# Patient Record
Sex: Female | Born: 1975 | Race: White | Hispanic: No | Marital: Single | Smoking: Former smoker
Health system: Southern US, Community
[De-identification: ages and names within clinical notes are randomized; demographics above are authoritative.]

## PROBLEM LIST (undated history)

## (undated) ENCOUNTER — Emergency Department (HOSPITAL_COMMUNITY): Payer: Self-pay | Source: Home / Self Care

## (undated) DIAGNOSIS — F329 Major depressive disorder, single episode, unspecified: Secondary | ICD-10-CM

## (undated) DIAGNOSIS — F32A Depression, unspecified: Secondary | ICD-10-CM

## (undated) DIAGNOSIS — I10 Essential (primary) hypertension: Secondary | ICD-10-CM

## (undated) DIAGNOSIS — E7800 Pure hypercholesterolemia, unspecified: Secondary | ICD-10-CM

## (undated) DIAGNOSIS — G2581 Restless legs syndrome: Secondary | ICD-10-CM

## (undated) DIAGNOSIS — F419 Anxiety disorder, unspecified: Secondary | ICD-10-CM

## (undated) DIAGNOSIS — N946 Dysmenorrhea, unspecified: Secondary | ICD-10-CM

## (undated) HISTORY — PX: HERNIA REPAIR: SHX51

## (undated) HISTORY — PX: CHOLECYSTECTOMY: SHX55

## (undated) HISTORY — PX: TUBAL LIGATION: SHX77

## (undated) HISTORY — DX: Anxiety disorder, unspecified: F41.9

## (undated) HISTORY — DX: Pure hypercholesterolemia, unspecified: E78.00

---

## 1997-08-16 ENCOUNTER — Emergency Department (HOSPITAL_COMMUNITY): Admission: EM | Admit: 1997-08-16 | Discharge: 1997-08-16 | Payer: Self-pay | Admitting: Emergency Medicine

## 1998-08-09 ENCOUNTER — Ambulatory Visit (HOSPITAL_COMMUNITY): Admission: RE | Admit: 1998-08-09 | Discharge: 1998-08-09 | Payer: Self-pay | Admitting: Orthopaedic Surgery

## 1998-09-20 ENCOUNTER — Encounter: Payer: Self-pay | Admitting: Family Medicine

## 1998-09-20 ENCOUNTER — Ambulatory Visit (HOSPITAL_COMMUNITY): Admission: RE | Admit: 1998-09-20 | Discharge: 1998-09-20 | Payer: Self-pay | Admitting: Family Medicine

## 1998-11-12 ENCOUNTER — Ambulatory Visit (HOSPITAL_COMMUNITY): Admission: RE | Admit: 1998-11-12 | Discharge: 1998-11-12 | Payer: Self-pay | Admitting: Family Medicine

## 1998-11-12 ENCOUNTER — Encounter: Payer: Self-pay | Admitting: Family Medicine

## 1998-12-13 ENCOUNTER — Ambulatory Visit (HOSPITAL_COMMUNITY): Admission: RE | Admit: 1998-12-13 | Discharge: 1998-12-13 | Payer: Self-pay | Admitting: Family Medicine

## 1998-12-13 ENCOUNTER — Encounter: Payer: Self-pay | Admitting: Family Medicine

## 1999-02-17 ENCOUNTER — Ambulatory Visit (HOSPITAL_COMMUNITY): Admission: RE | Admit: 1999-02-17 | Discharge: 1999-02-17 | Payer: Self-pay | Admitting: Family Medicine

## 1999-02-17 ENCOUNTER — Encounter: Payer: Self-pay | Admitting: Family Medicine

## 1999-03-05 ENCOUNTER — Inpatient Hospital Stay (HOSPITAL_COMMUNITY): Admission: AD | Admit: 1999-03-05 | Discharge: 1999-03-05 | Payer: Self-pay | Admitting: Family Medicine

## 1999-03-28 ENCOUNTER — Inpatient Hospital Stay (HOSPITAL_COMMUNITY): Admission: AD | Admit: 1999-03-28 | Discharge: 1999-03-28 | Payer: Self-pay | Admitting: Family Medicine

## 1999-04-24 ENCOUNTER — Inpatient Hospital Stay (HOSPITAL_COMMUNITY): Admission: AD | Admit: 1999-04-24 | Discharge: 1999-04-26 | Payer: Self-pay

## 1999-12-31 ENCOUNTER — Encounter: Payer: Self-pay | Admitting: Family Medicine

## 1999-12-31 ENCOUNTER — Ambulatory Visit (HOSPITAL_COMMUNITY): Admission: RE | Admit: 1999-12-31 | Discharge: 1999-12-31 | Payer: Self-pay | Admitting: Family Medicine

## 2001-06-29 ENCOUNTER — Other Ambulatory Visit: Admission: RE | Admit: 2001-06-29 | Discharge: 2001-06-29 | Payer: Self-pay | Admitting: Family Medicine

## 2001-09-30 ENCOUNTER — Encounter: Payer: Self-pay | Admitting: Family Medicine

## 2001-09-30 ENCOUNTER — Ambulatory Visit (HOSPITAL_COMMUNITY): Admission: RE | Admit: 2001-09-30 | Discharge: 2001-09-30 | Payer: Self-pay | Admitting: Family Medicine

## 2001-12-20 ENCOUNTER — Ambulatory Visit (HOSPITAL_COMMUNITY): Admission: RE | Admit: 2001-12-20 | Discharge: 2001-12-20 | Payer: Self-pay | Admitting: Family Medicine

## 2001-12-20 ENCOUNTER — Encounter: Payer: Self-pay | Admitting: Family Medicine

## 2002-02-16 ENCOUNTER — Encounter: Payer: Self-pay | Admitting: Family Medicine

## 2002-02-16 ENCOUNTER — Inpatient Hospital Stay (HOSPITAL_COMMUNITY): Admission: AD | Admit: 2002-02-16 | Discharge: 2002-02-16 | Payer: Self-pay | Admitting: *Deleted

## 2002-05-23 ENCOUNTER — Inpatient Hospital Stay (HOSPITAL_COMMUNITY): Admission: AD | Admit: 2002-05-23 | Discharge: 2002-05-26 | Payer: Self-pay | Admitting: Family Medicine

## 2002-05-25 ENCOUNTER — Encounter (INDEPENDENT_AMBULATORY_CARE_PROVIDER_SITE_OTHER): Payer: Self-pay | Admitting: *Deleted

## 2002-07-06 ENCOUNTER — Other Ambulatory Visit: Admission: RE | Admit: 2002-07-06 | Discharge: 2002-07-06 | Payer: Self-pay | Admitting: Family Medicine

## 2003-04-02 ENCOUNTER — Emergency Department (HOSPITAL_COMMUNITY): Admission: EM | Admit: 2003-04-02 | Discharge: 2003-04-02 | Payer: Self-pay | Admitting: Emergency Medicine

## 2003-08-02 ENCOUNTER — Other Ambulatory Visit: Admission: RE | Admit: 2003-08-02 | Discharge: 2003-08-02 | Payer: Self-pay | Admitting: Family Medicine

## 2004-08-07 ENCOUNTER — Other Ambulatory Visit: Admission: RE | Admit: 2004-08-07 | Discharge: 2004-08-07 | Payer: Self-pay | Admitting: Family Medicine

## 2005-08-14 ENCOUNTER — Other Ambulatory Visit: Admission: RE | Admit: 2005-08-14 | Discharge: 2005-08-14 | Payer: Self-pay | Admitting: Family Medicine

## 2006-08-12 ENCOUNTER — Other Ambulatory Visit: Admission: RE | Admit: 2006-08-12 | Discharge: 2006-08-12 | Payer: Self-pay | Admitting: Family Medicine

## 2006-09-02 ENCOUNTER — Ambulatory Visit (HOSPITAL_COMMUNITY): Admission: RE | Admit: 2006-09-02 | Discharge: 2006-09-02 | Payer: Self-pay | Admitting: Family Medicine

## 2009-06-21 ENCOUNTER — Encounter: Admission: RE | Admit: 2009-06-21 | Discharge: 2009-06-21 | Payer: Self-pay | Admitting: Family Medicine

## 2010-06-13 NOTE — Op Note (Signed)
   NAME:  Tara Woods, Tara Woods                        ACCOUNT NO.:  [id number]   MEDICAL RECORD NO.:  [id number]                   PATIENT TYPE:  INP   LOCATION:  9137                                 FACILITY:  [facility]   PHYSICIAN:  Conni Elliot, M.D.             DATE OF BIRTH:  [birth year]   DATE OF PROCEDURE:  05/25/2002  DATE OF DISCHARGE:                                 OPERATIVE REPORT   PREOPERATIVE DIAGNOSIS:  Desire for surgical sterilization.   POSTOPERATIVE DIAGNOSIS:  Desire for surgical sterilization.   PROCEDURE:  Modified bilateral Pomeroy tubal ligation.   SURGEON:  Conni Elliot, M.D.   ANESTHESIA:  General endotracheal.   OPERATIVE FINDINGS:  Uterus, tubes, and ovaries normal for post-gravid  state.   DESCRIPTION OF PROCEDURE:  With the patient placed under general  endotracheal anesthesia, the patient supine, the abdomen was prepped and  draped in sterile fashion.  A preumbilical incision was made and incision  made in the fascia, rectus muscles split in the midline, peritoneal cavity  entered.  The right fallopian tube was identified, grasped with a Babcock  clamp, followed to the fimbriated end.  A single tube was brought into the  operative field, doubly suture ligated, and an approximately 1.5-2 cm  segment was excised.  Hemostasis was adequate.  The procedure was repeated  on the opposite side.  Hemostasis was again adequate.  The anterior  peritoneum, fascia, subcutaneous, and skin were closed.  Estimated blood  loss was then 10 mL.  Needle and sponge count correct.                                               Conni Elliot, M.D.    ASG/MEDQ  D:  05/25/2002  T:  05/25/2002  Job:  [id number]

## 2011-05-30 ENCOUNTER — Emergency Department (HOSPITAL_COMMUNITY)
Admission: EM | Admit: 2011-05-30 | Discharge: 2011-05-30 | Disposition: A | Discharge status: Home / Self Care | Payer: Self-pay | Attending: Emergency Medicine | Admitting: Emergency Medicine

## 2011-05-30 ENCOUNTER — Encounter (HOSPITAL_COMMUNITY): Payer: Self-pay

## 2011-05-30 ENCOUNTER — Emergency Department (HOSPITAL_COMMUNITY): Payer: Self-pay

## 2011-05-30 DIAGNOSIS — IMO0002 Reserved for concepts with insufficient information to code with codable children: Secondary | ICD-10-CM | POA: Insufficient documentation

## 2011-05-30 DIAGNOSIS — F17200 Nicotine dependence, unspecified, uncomplicated: Secondary | ICD-10-CM | POA: Insufficient documentation

## 2011-05-30 DIAGNOSIS — R29898 Other symptoms and signs involving the musculoskeletal system: Secondary | ICD-10-CM | POA: Insufficient documentation

## 2011-05-30 DIAGNOSIS — M792 Neuralgia and neuritis, unspecified: Secondary | ICD-10-CM

## 2011-05-30 DIAGNOSIS — R209 Unspecified disturbances of skin sensation: Secondary | ICD-10-CM | POA: Insufficient documentation

## 2011-05-30 LAB — COMPREHENSIVE METABOLIC PANEL
ALT: 11 U/L (ref 0–35)
Alkaline Phosphatase: 70 U/L (ref 39–117)
BUN: 8 mg/dL (ref 6–23)
CO2: 25 mEq/L (ref 19–32)
Chloride: 101 mEq/L (ref 96–112)
GFR calc Af Amer: >90 mL/min (ref >90)
GFR calc non Af Amer: >90 mL/min (ref >90)
Glucose, Bld: 100 mg/dL — ABNORMAL HIGH (ref 70–99)
Potassium: 3.7 mEq/L (ref 3.5–5.1)
Sodium: 136 mEq/L (ref 135–145)
Total Bilirubin: 0.1 mg/dL — ABNORMAL LOW (ref 0.3–1.2)
Total Protein: 7.5 g/dL (ref 6.0–8.3)

## 2011-05-30 LAB — DIFFERENTIAL
Lymphocytes Relative: 38 % (ref 12–46)
Lymphs Abs: 3.6 10*3/uL (ref 0.7–4.0)
Monocytes Relative: 4 % (ref 3–12)
Neutro Abs: 5.3 10*3/uL (ref 1.7–7.7)
Neutrophils Relative %: 56 % (ref 43–77)

## 2011-05-30 LAB — CBC
Hemoglobin: 13.4 g/dL (ref 12.0–15.0)
MCH: 28.8 pg (ref 26.0–34.0)
Platelets: 257 10*3/uL (ref 150–400)
RBC: 4.66 MIL/uL (ref 3.87–5.11)
WBC: 9.5 10*3/uL (ref 4.0–10.5)

## 2011-05-30 NOTE — ED Notes (Signed)
Pt c/o left leg and arm weak and tingling, left eye hazy, pt states that the tingling started Tuesday on her left great toe and has spread to left arm and right foot area now. Pt  was seen at [facility], diagnosed with neuropathy, pt states that she is not getting any better, pt denies any pain, denies any recent illness.

## 2011-05-30 NOTE — Discharge Instructions (Signed)
Return tomorrow for ultrasound left lower leg.  If ultrasound neg then follow up with Dr. Abundio Miu

## 2011-05-30 NOTE — ED Notes (Signed)
Pt reprots numbness that started on left leg great toe and has spread gradually up to left leg and left arm. Denies any other symptoms.  Describes left eye as "being fuzzy today", was seen at a local er and told neuropathy

## 2011-05-30 NOTE — ED Notes (Addendum)
Pt returned from xray, ambulatory to restroom, tolerated well, update given

## 2011-05-30 NOTE — ED Provider Notes (Signed)
History     CSN: [id number]  Arrival date & time 05/30/11  1628   First MD Initiated Contact with Patient 05/30/11 1656      Chief Complaint  Patient presents with  . Numbness  . Extremity Weakness    (Consider location/radiation/quality/duration/timing/severity/associated sxs/prior treatment) Patient is a 36 y.o. female presenting with extremity weakness. The history is provided by the patient (the pt complains of numbness to her left leg and some in her hands and right foot). No language interpreter was used.  Extremity Weakness This is a new problem. The current episode started more than 2 days ago. The problem occurs constantly. The problem has not changed since onset.Pertinent negatives include no chest pain, no abdominal pain and no headaches. The symptoms are aggravated by nothing. The symptoms are relieved by nothing. Treatments tried: voltaren. The treatment provided no relief.    History reviewed. No pertinent past medical history.  Past Surgical History  Procedure Date  . Tubal ligation   . Cholecystectomy   . Hernia repair     No family history on file.  History  Substance Use Topics  . Smoking status: Current Everyday Smoker    Types: Cigarettes  . Smokeless tobacco: Not on file  . Alcohol Use: No    OB History    Grav Para Term Preterm Abortions TAB SAB Ect Mult Living                  Review of Systems  Constitutional: Negative for fatigue.  HENT: Negative for congestion, sinus pressure and ear discharge.   Eyes: Negative for discharge.  Respiratory: Negative for cough.   Cardiovascular: Negative for chest pain.  Gastrointestinal: Negative for abdominal pain and diarrhea.  Genitourinary: Negative for frequency and hematuria.  Musculoskeletal: Positive for extremity weakness. Negative for back pain.       Numbness to left leg  Skin: Negative for rash.  Neurological: Negative for seizures and headaches.  Hematological: Negative.     Psychiatric/Behavioral: Negative for hallucinations.    Allergies  Amoxapine and related and Penicillins  Home Medications   Current Outpatient Rx  Name Route Sig Dispense Refill  . DICLOFENAC SODIUM 75 MG PO TBEC Oral Take 75 mg by mouth 2 (two) times daily.      BP 107/60  Pulse 87  Temp(Src) 98.3 F (36.8 C) (Oral)  Resp 18  Ht 5\' 4"  (1.626 m)  Wt 141 lb (63.957 kg)  BMI 24.20 kg/m2  SpO2 98%  LMP 05/18/2011  Physical Exam  Constitutional: She is oriented to person, place, and time. She appears well-developed.  HENT:  Head: Normocephalic and atraumatic.  Eyes: Conjunctivae and EOM are normal. No scleral icterus.  Neck: Neck supple. No thyromegaly present.  Cardiovascular: Normal rate and regular rhythm.  Exam reveals no gallop and no friction rub.   No murmur heard. Pulmonary/Chest: No stridor. She has no wheezes. She has no rales. She exhibits no tenderness.  Abdominal: She exhibits no distension. There is no tenderness. There is no rebound.  Musculoskeletal: Normal range of motion. She exhibits no edema.       Mild decrease sensation left foot.  Minor tenderness to popliteal area.  Reflexes and strength nl  Lymphadenopathy:    She has no cervical adenopathy.  Neurological: She is oriented to person, place, and time. Coordination normal.  Skin: No rash noted. No erythema.  Psychiatric: She has a normal mood and affect. Her behavior is normal.    ED Course  Procedures (including critical care time)  Labs Reviewed  COMPREHENSIVE METABOLIC PANEL - Abnormal; Notable for the following:    Glucose, Bld 100 (*)    Total Bilirubin 0.1 (*)    All other components within normal limits  CBC  DIFFERENTIAL   Ct Head Wo Contrast  05/30/2011  *RADIOLOGY REPORT*  Clinical Data: 36 year old female with left upper extremity tingling and weakness.  CT HEAD WITHOUT CONTRAST  Technique:  Contiguous axial images were obtained from the base of the skull through the vertex without  contrast.  Comparison: None.  Findings: Visualized paranasal sinuses and mastoids are clear.  No acute osseous abnormality identified.  Visualized orbits and scalp soft tissues are within normal limits.  The Cerebral volume is within normal limits for age.  No midline shift, ventriculomegaly, mass effect, evidence of mass lesion, intracranial hemorrhage or evidence of cortically based acute infarction.  Gray-white matter differentiation is within normal limits throughout the brain.  No suspicious intracranial vascular hyperdensity.  IMPRESSION: Normal noncontrast CT appearance of the brain.  Original Report Authenticated By: Ulla Potash III, M.D.     1. Neuritis       MDM  Neuritis left lower extr.  Unknown cause.  Will get [place] in am        Benny Lennert, MD 05/30/11 1950

## 2011-05-31 ENCOUNTER — Ambulatory Visit (HOSPITAL_COMMUNITY)
Admit: 2011-05-31 | Discharge: 2011-05-31 | Disposition: A | Discharge status: Home / Self Care | Payer: Self-pay | Source: Ambulatory Visit | Attending: Emergency Medicine | Admitting: Emergency Medicine

## 2011-05-31 ENCOUNTER — Other Ambulatory Visit (HOSPITAL_COMMUNITY): Payer: Self-pay | Admitting: Emergency Medicine

## 2011-05-31 DIAGNOSIS — R52 Pain, unspecified: Secondary | ICD-10-CM

## 2011-05-31 DIAGNOSIS — M7989 Other specified soft tissue disorders: Secondary | ICD-10-CM | POA: Insufficient documentation

## 2011-05-31 DIAGNOSIS — M79609 Pain in unspecified limb: Secondary | ICD-10-CM | POA: Insufficient documentation

## 2014-07-17 ENCOUNTER — Other Ambulatory Visit (HOSPITAL_COMMUNITY): Payer: Self-pay | Admitting: Family Medicine

## 2014-07-17 DIAGNOSIS — M7989 Other specified soft tissue disorders: Principal | ICD-10-CM

## 2014-07-17 DIAGNOSIS — M79662 Pain in left lower leg: Secondary | ICD-10-CM

## 2014-07-18 ENCOUNTER — Ambulatory Visit (HOSPITAL_COMMUNITY)
Admission: RE | Admit: 2014-07-18 | Discharge: 2014-07-18 | Disposition: A | Discharge status: Home / Self Care | Payer: 59 | Source: Ambulatory Visit | Attending: Family Medicine | Admitting: Family Medicine

## 2014-07-18 DIAGNOSIS — M7989 Other specified soft tissue disorders: Secondary | ICD-10-CM | POA: Diagnosis not present

## 2014-07-18 DIAGNOSIS — M79605 Pain in left leg: Secondary | ICD-10-CM

## 2014-07-18 DIAGNOSIS — M79662 Pain in left lower leg: Secondary | ICD-10-CM

## 2014-07-18 NOTE — Progress Notes (Signed)
*  PRELIMINARY RESULTS* Vascular Ultrasound Left Lower Extremity Venous Duplex Completed.  Preliminary Results   Preliminary Results  Preliminary Results  Preliminary Results: Left No evidence of DVT, Superficial Thrombosis, or Backer's Cyst.  Tara Woods M 07/18/2014, 9:21 AM

## 2017-01-13 ENCOUNTER — Other Ambulatory Visit: Payer: Self-pay

## 2017-01-13 ENCOUNTER — Emergency Department (HOSPITAL_COMMUNITY): Payer: Managed Care, Other (non HMO)

## 2017-01-13 ENCOUNTER — Emergency Department (HOSPITAL_COMMUNITY)
Admission: EM | Admit: 2017-01-13 | Discharge: 2017-01-13 | Disposition: A | Discharge status: Home / Self Care | Payer: Managed Care, Other (non HMO) | Attending: Emergency Medicine | Admitting: Emergency Medicine

## 2017-01-13 ENCOUNTER — Encounter (HOSPITAL_COMMUNITY): Payer: Self-pay | Admitting: Emergency Medicine

## 2017-01-13 DIAGNOSIS — R197 Diarrhea, unspecified: Secondary | ICD-10-CM | POA: Diagnosis not present

## 2017-01-13 DIAGNOSIS — F17210 Nicotine dependence, cigarettes, uncomplicated: Secondary | ICD-10-CM | POA: Diagnosis not present

## 2017-01-13 DIAGNOSIS — Z9049 Acquired absence of other specified parts of digestive tract: Secondary | ICD-10-CM | POA: Insufficient documentation

## 2017-01-13 DIAGNOSIS — R1013 Epigastric pain: Secondary | ICD-10-CM | POA: Diagnosis not present

## 2017-01-13 DIAGNOSIS — R112 Nausea with vomiting, unspecified: Secondary | ICD-10-CM | POA: Diagnosis present

## 2017-01-13 DIAGNOSIS — Z79899 Other long term (current) drug therapy: Secondary | ICD-10-CM | POA: Insufficient documentation

## 2017-01-13 LAB — PREGNANCY, URINE: PREG TEST UR: NEGATIVE

## 2017-01-13 LAB — COMPREHENSIVE METABOLIC PANEL
ALBUMIN: 3.8 g/dL (ref 3.5–5.0)
ALK PHOS: 66 U/L (ref 38–126)
ALT: 14 U/L (ref 14–54)
ANION GAP: 11 (ref 5–15)
AST: 23 U/L (ref 15–41)
BILIRUBIN TOTAL: 0.3 mg/dL (ref 0.3–1.2)
BUN: <5 mg/dL — ABNORMAL LOW (ref 6–20)
CALCIUM: 9.6 mg/dL (ref 8.9–10.3)
CO2: 27 mmol/L (ref 22–32)
Chloride: 104 mmol/L (ref 101–111)
Creatinine, Ser: 0.61 mg/dL (ref 0.44–1.00)
GFR calc non Af Amer: >60 mL/min (ref >60)
GLUCOSE: 108 mg/dL — AB (ref 65–99)
POTASSIUM: 3.2 mmol/L — AB (ref 3.5–5.1)
SODIUM: 142 mmol/L (ref 135–145)
TOTAL PROTEIN: 7.9 g/dL (ref 6.5–8.1)

## 2017-01-13 LAB — CBC WITH DIFFERENTIAL/PLATELET
BASOS ABS: 0.1 10*3/uL (ref 0.0–0.1)
BASOS PCT: 0 %
Eosinophils Absolute: 0.1 10*3/uL (ref 0.0–0.7)
Eosinophils Relative: 0 %
HEMATOCRIT: 39.7 % (ref 36.0–46.0)
HEMOGLOBIN: 12.7 g/dL (ref 12.0–15.0)
LYMPHS PCT: 22 %
Lymphs Abs: 3.5 10*3/uL (ref 0.7–4.0)
MCH: 27.2 pg (ref 26.0–34.0)
MCHC: 32 g/dL (ref 30.0–36.0)
MCV: 85 fL (ref 78.0–100.0)
Monocytes Absolute: 1 10*3/uL (ref 0.1–1.0)
Monocytes Relative: 7 %
NEUTROS ABS: 11.2 10*3/uL — AB (ref 1.7–7.7)
NEUTROS PCT: 71 %
Platelets: 418 10*3/uL — ABNORMAL HIGH (ref 150–400)
RBC: 4.67 MIL/uL (ref 3.87–5.11)
RDW: 14.1 % (ref 11.5–15.5)
WBC: 15.8 10*3/uL — ABNORMAL HIGH (ref 4.0–10.5)

## 2017-01-13 LAB — URINALYSIS, ROUTINE W REFLEX MICROSCOPIC
Bilirubin Urine: NEGATIVE
Glucose, UA: NEGATIVE mg/dL
Hgb urine dipstick: NEGATIVE
Ketones, ur: NEGATIVE mg/dL
LEUKOCYTES UA: NEGATIVE
NITRITE: NEGATIVE
PH: 6 (ref 5.0–8.0)
Protein, ur: NEGATIVE mg/dL
SPECIFIC GRAVITY, URINE: 1.005 (ref 1.005–1.030)

## 2017-01-13 LAB — POC URINE PREG, ED: PREG TEST UR: NEGATIVE

## 2017-01-13 LAB — LIPASE, BLOOD: Lipase: 27 U/L (ref 11–51)

## 2017-01-13 MED ORDER — DICYCLOMINE HCL 20 MG PO TABS: 20.0000 mg | ORAL_TABLET | Freq: Two times a day (BID) | ORAL | 0 refills | Status: DC

## 2017-01-13 MED ORDER — ONDANSETRON HCL 4 MG PO TABS: 4.0000 mg | ORAL_TABLET | Freq: Four times a day (QID) | ORAL | 0 refills | Status: DC

## 2017-01-13 MED ORDER — POTASSIUM CHLORIDE ER 10 MEQ PO TBCR: 10.0000 meq | EXTENDED_RELEASE_TABLET | Freq: Two times a day (BID) | ORAL | 0 refills | Status: DC

## 2017-01-13 MED ORDER — IOPAMIDOL (ISOVUE-300) INJECTION 61%
100.0000 mL | Freq: Once | INTRAVENOUS | Status: AC | PRN
  Administered 2017-01-13: 100 mL via INTRAVENOUS

## 2017-01-13 MED ORDER — SODIUM CHLORIDE 0.9 % IV BOLUS (SEPSIS)
1000.0000 mL | Freq: Once | INTRAVENOUS | Status: AC
  Administered 2017-01-13: 1000 mL via INTRAVENOUS

## 2017-01-13 NOTE — ED Provider Notes (Signed)
[facility] EMERGENCY DEPARTMENT Provider Note   CSN: [id number] Arrival date & time: 01/13/17  4742     History   Chief Complaint Chief Complaint  Patient presents with  . Abdominal Pain  . Emesis  . Diarrhea    HPI Tara Woods is a 41 y.o. female.  HPI   Patient is a 41 year old female with an abdominal surgical history of cholecystectomy, hernia repair, and tubal ligation and no other medical history presenting for nausea, vomiting, diarrhea, and epigastric to right-sided pain for the past 24-48 hours.  Patient reports that she began having a sore throat approximately 5 days ago and presented to urgent care where she was given azithromycin.  Subsequently, patient developed loose stools without melena or hematochezia.  Patient reports that she progressed to having nausea with emesis throughout this week.  Approximately 24-48 hours ago, patient began having a right upper quadrant dull ache that then progressed down her right lower abdomen to her right lower quadrant.  Pain does not radiate up to the right shoulder or into the back.  No pain in the flanks.  Patient reports it feels similar to her prior episode of gallstones, however she had a cholecystectomy.  No fever or chills with this illness.  Patient denies dysuria, vaginal discharge, or vaginal bleeding.  Last menstrual period was 1.5 weeks ago.  Patient is sexually active with one female partner.  History reviewed. No pertinent past medical history.  There are no active problems to display for this patient.   Past Surgical History:  Procedure Laterality Date  . CHOLECYSTECTOMY    . HERNIA REPAIR    . TUBAL LIGATION      OB History    No data available       Home Medications    Prior to Admission medications   Medication Sig Start Date End Date Taking? Authorizing Provider  diclofenac (VOLTAREN) 75 MG EC tablet Take 75 mg by mouth 2 (two) times daily.    [provider]    Family History History  reviewed. No pertinent family history.  Social History Social History   Tobacco Use  . Smoking status: Current Every Day Smoker    Types: Cigarettes  . Smokeless tobacco: Never Used  Substance Use Topics  . Alcohol use: No  . Drug use: No     Allergies   Amoxapine and related and Penicillins   Review of Systems Review of Systems  Constitutional: Negative for chills and fever.  HENT: Negative for congestion and sore throat.   Respiratory: Negative for cough.   Cardiovascular: Negative for chest pain.  Gastrointestinal: Positive for abdominal distention, diarrhea, nausea and vomiting. Negative for blood in stool.  Genitourinary: Negative for dysuria and flank pain.  Musculoskeletal: Negative for back pain.  Skin: Negative for rash.  All other systems reviewed and are negative.    Physical Exam Updated Vital Signs BP 118/82   Pulse (!) 108   Temp 98.2 F (36.8 C) (Oral)   Resp 17   LMP 01/04/2017   SpO2 96%   Physical Exam  Constitutional: She appears well-developed and well-nourished. No distress.  HENT:  Head: Normocephalic and atraumatic.  Mouth/Throat: Oropharynx is clear and moist.  Eyes: Conjunctivae and EOM are normal. Pupils are equal, round, and reactive to light.  Neck: Normal range of motion. Neck supple.  Cardiovascular: Normal rate, regular rhythm, S1 normal and S2 normal.  No murmur heard. Pulmonary/Chest: Effort normal and breath sounds normal. She has no wheezes.  She has no rales.  Abdominal: Soft. She exhibits no distension. There is tenderness in the right lower quadrant and epigastric area. There is no rebound, no guarding and no CVA tenderness.  No suprapubic or left lower quadrant tenderness.  Musculoskeletal: Normal range of motion. She exhibits no edema or deformity.  Lymphadenopathy:    She has no cervical adenopathy.  Neurological: She is alert.  Patient was extremities symmetrically and with good coordination.  Skin: Skin is warm and  dry. No rash noted. No erythema.  Psychiatric: She has a normal mood and affect. Her behavior is normal. Judgment and thought content normal.  Nursing note and vitals reviewed.    ED Treatments / Results  Labs (all labs ordered are listed, but only abnormal results are displayed) Labs Reviewed  COMPREHENSIVE METABOLIC PANEL  CBC WITH DIFFERENTIAL/PLATELET  LIPASE, BLOOD  URINALYSIS, ROUTINE W REFLEX MICROSCOPIC  PREGNANCY, URINE  POC URINE PREG, ED    EKG  EKG Interpretation None       Radiology Ct Abdomen Pelvis W Contrast  Result Date: 01/13/2017 CLINICAL DATA:  41 year old with abdominal pain and suspected appendicitis. History of cholecystectomy. EXAM: CT ABDOMEN AND PELVIS WITH CONTRAST TECHNIQUE: Multidetector CT imaging of the abdomen and pelvis was performed using the standard protocol following bolus administration of intravenous contrast. CONTRAST:  167mL ISOVUE-300 IOPAMIDOL (ISOVUE-300) INJECTION 61% COMPARISON:  11/28/2010 FINDINGS: Lower chest: Minimal scarring or atelectasis at the lung bases. Otherwise, the lung bases are clear. Hepatobiliary: Gallbladder has been removed. Normal appearance of the liver without biliary dilatation. Portal venous system is patent. Pancreas: Normal appearance of the pancreas without inflammation or duct dilatation. Spleen: Normal appearance of spleen without enlargement. Adrenals/Urinary Tract: Normal adrenal glands. Urinary bladder is unremarkable. Negative for hydronephrosis. 1.1 cm low-density structure in the left kidney interpolar region is most compatible with a cyst but this has enlarged from the previous examination. No suspicious renal lesions. Stomach/Bowel: Stomach is within normal limits. Appendix appears normal. No evidence of bowel wall thickening, distention, or inflammatory changes. Vascular/Lymphatic: Main visceral arteries are patent. Atherosclerotic calcifications in the abdominal aorta without aneurysm. Main venous  structures appear to be patent. No lymph node enlargement in the abdomen or pelvis. Reproductive: Again noted is a 1.2 cm low-density structure in the left adnexa region adjacent to the uterus on sequence 2, image 69. This has not changed since 2012 and probably represents a small benign adnexal cyst. Alternatively, this could represent an exophytic low-density fibroid. No acute abnormality involving the uterus or ovaries. Other: Trace fluid in the pelvis is likely physiologic. Negative for free air. Musculoskeletal: No acute or significant osseous findings. IMPRESSION: No acute abnormality in the abdomen or pelvis. Specifically, normal appearance of the appendix. Electronically Signed   By: Markus Daft M.D.   On: 01/13/2017 12:36    Procedures Procedures (including critical care time)  Medications Ordered in ED Medications  sodium chloride 0.9 % bolus 1,000 mL (1,000 mLs Intravenous New Bag/Given 01/13/17 1062)     Initial Impression / Assessment and Plan / ED Course  I have reviewed the triage vital signs and the nursing notes.  Pertinent labs & imaging results that were available during my care of the patient were reviewed by me and considered in my medical decision making (see chart for details).     Final Clinical Impressions(s) / ED Diagnoses   Final diagnoses:  Epigastric pain  Non-intractable vomiting with nausea, unspecified vomiting type  Diarrhea, unspecified type   Differential diagnosis includes  pancreatitis, choledocholithiasis, gastritis, pyelonephritis, appendicitis, SBO.  Will obtain CBC, CMP, lipase, urinalysis, and urine pregnancy.  CT abdomen pelvis with contrast ordered.  Engaged in shared decision making with patient regarding the utility of CT and the current episode of abdominal pain.  Patient understood risks of radiation was agreeable to continue with CT. patient initially slightly tachycardic.  This was rechecked in room and pulse is 99.  Will initiate fluid  repletion.  9:59 AM Patient was offered medication for pain and nausea.  Patient declined any medication at this time.  I discussed with patient that should her pain get worse or she require antiemetics, they can be ordered for her.  Patient is afebrile, nontoxic appearing, in no apparent distress.  Patient's pain and other symptoms adequately managed in emergency department.  Fluid bolus given.  Labs, imaging and vitals reviewed.  Patient does not meet the SIRS or Sepsis criteria.  On repeat exam patient does not have a surgical abdomen and there are no peritoneal signs.  No indication of appendicitis, bowel obstruction, bowel perforation, cholecystitis, diverticulitis, acute mesenteric ischemia, PID, ectopic pregnancy, or ovarian torsion.  CT scan negative for these pathologies.  I reviewed the CT scan with the patient and gave her the report.  Patient discharged home with Zofran, Bentyl, and K replacement for symptomatic treatment and given strict instructions for follow-up with their primary care physician.  I have also discussed reasons to return immediately to the ED.  Patient expresses understanding and agrees with plan.  Case discussed with Dr. Francine Graven.  ED Discharge Orders    None       Albesa Seen, PA-C 01/13/17 [place], Wilbur, DO 01/16/17 1529

## 2017-01-13 NOTE — ED Triage Notes (Signed)
Pt c/o n/v/d and RUQ pain x 3 days. States had cholecystectomy 2013.states this feels like gall bladder attack.  A/o. Color wnl. Mm moist. Emesis x 3 and diarrhea x less than 10 over past 24 hrs.

## 2017-01-13 NOTE — Discharge Instructions (Signed)
Please read and follow all provided instructions.  Your diagnoses today include:  1. Epigastric pain   2. Non-intractable vomiting with nausea, unspecified vomiting type   3. Diarrhea, unspecified type     Tests performed today include: Blood counts and electrolytes Blood tests to check liver and kidney function Blood tests to check pancreas function Urine test to look for infection and pregnancy (in women) Vital signs. See below for your results today.  CT scan of abdomen and pelvis-normal  Medications prescribed:   Take any prescribed medications only as directed.  Bentyl-this is an antispasmodic agent.  Zofran-this is to help with nausea  Potassium replacement  On testing today, your potassium level was 3.2 which is slightly below normal. I suspect this is due to vomiting. Increasing potassium in your diet should be sufficient to make sure you are getting enough potassium. Below is a list of good sources and servings high in potassium (in order from greatest to least):  -1 cup cooked acorn squash -1 baked potato with skin -1 cup cooked spinach -1 cup cooked lentils -1 cup cooked kidney beans -Watermelon - cup raisins -1 cup plain yogurt -1 cup orange juice, frozen -Banana -1 cup 1% low-fat milk -1 cup cooked broccoli   Home care instructions:  Follow any educational materials contained in this packet.  Your abdominal pain, nausea, vomiting, and diarrhea may be caused by a viral gastroenteritis also called 'stomach flu'. You should rest for the next several days. Keep drinking plenty of fluids and use the medicine for nausea as directed.   Drink clear liquids for the next 24 hours and introduce solid foods slowly after 24 hours using the b.r.a.t. diet (Bananas, Rice, Applesauce, Toast, Yogurt).    Follow-up instructions: Please follow-up with your primary care provider in the next 2 days for further evaluation of your symptoms. If you are not feeling better in 48  hours you may have a condition that is more serious and you need re-evaluation.   Return instructions:  SEEK IMMEDIATE MEDICAL ATTENTION IF: If you have pain that does not go away or becomes severe  A temperature above 101F develops  Repeated vomiting occurs (multiple episodes)  If you have pain that becomes localized to portions of the abdomen. The right side could possibly be appendicitis. In an adult, the left lower portion of the abdomen could be colitis or diverticulitis.  Blood is being passed in stools or vomit (bright red or black tarry stools)  You develop chest pain, difficulty breathing, dizziness or fainting, or become confused, poorly responsive, or inconsolable (young children) If you have any other emergent concerns regarding your health  Additional Information: Abdominal (belly) pain can be caused by many things. Your caregiver performed an examination and possibly ordered blood/urine tests and imaging (CT scan, x-rays, ultrasound). Many cases can be observed and treated at home after initial evaluation in the emergency department. Even though you are being discharged home, abdominal pain can be unpredictable. Therefore, you need a repeated exam if your pain does not resolve, returns, or worsens. Most patients with abdominal pain don't have to be admitted to the hospital or have surgery, but serious problems like appendicitis and gallbladder attacks can start out as nonspecific pain. Many abdominal conditions cannot be diagnosed in one visit, so follow-up evaluations are very important.  Your vital signs today were: BP 118/82    Pulse (!) 108    Temp 98.2 F (36.8 C) (Oral)    Resp 17  LMP 01/04/2017    SpO2 96%  If your blood pressure (bp) was elevated above 135/85 this visit, please have this repeated by your doctor within one month. --------------

## 2017-10-23 ENCOUNTER — Encounter (HOSPITAL_COMMUNITY): Payer: Self-pay | Admitting: Emergency Medicine

## 2017-10-23 ENCOUNTER — Other Ambulatory Visit: Payer: Self-pay

## 2017-10-23 ENCOUNTER — Emergency Department (HOSPITAL_COMMUNITY)
Admission: EM | Admit: 2017-10-23 | Discharge: 2017-10-23 | Disposition: A | Discharge status: Home / Self Care | Payer: Managed Care, Other (non HMO) | Attending: Emergency Medicine | Admitting: Emergency Medicine

## 2017-10-23 DIAGNOSIS — N760 Acute vaginitis: Secondary | ICD-10-CM | POA: Insufficient documentation

## 2017-10-23 DIAGNOSIS — F32A Depression, unspecified: Secondary | ICD-10-CM

## 2017-10-23 DIAGNOSIS — F17210 Nicotine dependence, cigarettes, uncomplicated: Secondary | ICD-10-CM | POA: Insufficient documentation

## 2017-10-23 DIAGNOSIS — F329 Major depressive disorder, single episode, unspecified: Secondary | ICD-10-CM | POA: Insufficient documentation

## 2017-10-23 DIAGNOSIS — N938 Other specified abnormal uterine and vaginal bleeding: Secondary | ICD-10-CM

## 2017-10-23 DIAGNOSIS — B9689 Other specified bacterial agents as the cause of diseases classified elsewhere: Secondary | ICD-10-CM

## 2017-10-23 DIAGNOSIS — Z79899 Other long term (current) drug therapy: Secondary | ICD-10-CM | POA: Insufficient documentation

## 2017-10-23 HISTORY — DX: Depression, unspecified: F32.A

## 2017-10-23 HISTORY — DX: Major depressive disorder, single episode, unspecified: F32.9

## 2017-10-23 HISTORY — DX: Dysmenorrhea, unspecified: N94.6

## 2017-10-23 LAB — WET PREP, GENITAL
Sperm: NONE SEEN
Trich, Wet Prep: NONE SEEN
Yeast Wet Prep HPF POC: NONE SEEN

## 2017-10-23 LAB — CBC WITH DIFFERENTIAL/PLATELET
BASOS PCT: 0 %
Basophils Absolute: 0 10*3/uL (ref 0.0–0.1)
EOS ABS: 0.1 10*3/uL (ref 0.0–0.7)
Eosinophils Relative: 1 %
HCT: 39.2 % (ref 36.0–46.0)
HEMOGLOBIN: 12.6 g/dL (ref 12.0–15.0)
LYMPHS ABS: 4 10*3/uL (ref 0.7–4.0)
Lymphocytes Relative: 36 %
MCH: 27.6 pg (ref 26.0–34.0)
MCHC: 32.1 g/dL (ref 30.0–36.0)
MCV: 86 fL (ref 78.0–100.0)
Monocytes Absolute: 0.8 10*3/uL (ref 0.1–1.0)
Monocytes Relative: 8 %
NEUTROS PCT: 55 %
Neutro Abs: 5.9 10*3/uL (ref 1.7–7.7)
Platelets: 441 10*3/uL — ABNORMAL HIGH (ref 150–400)
RBC: 4.56 MIL/uL (ref 3.87–5.11)
RDW: 15.5 % (ref 11.5–15.5)
WBC: 10.9 10*3/uL — AB (ref 4.0–10.5)

## 2017-10-23 LAB — BASIC METABOLIC PANEL
Anion gap: 6 (ref 5–15)
BUN: 7 mg/dL (ref 6–20)
CHLORIDE: 105 mmol/L (ref 98–111)
CO2: 26 mmol/L (ref 22–32)
CREATININE: 0.6 mg/dL (ref 0.44–1.00)
Calcium: 8.8 mg/dL — ABNORMAL LOW (ref 8.9–10.3)
GFR calc non Af Amer: >60 mL/min (ref >60)
Glucose, Bld: 97 mg/dL (ref 70–99)
Potassium: 3.4 mmol/L — ABNORMAL LOW (ref 3.5–5.1)
SODIUM: 137 mmol/L (ref 135–145)

## 2017-10-23 LAB — URINALYSIS, ROUTINE W REFLEX MICROSCOPIC
Bilirubin Urine: NEGATIVE
GLUCOSE, UA: NEGATIVE mg/dL
KETONES UR: NEGATIVE mg/dL
LEUKOCYTES UA: NEGATIVE
NITRITE: NEGATIVE
PROTEIN: 30 mg/dL — AB
Specific Gravity, Urine: 1.019 (ref 1.005–1.030)
pH: 6 (ref 5.0–8.0)

## 2017-10-23 LAB — PREGNANCY, URINE: Preg Test, Ur: NEGATIVE

## 2017-10-23 MED ORDER — METRONIDAZOLE 500 MG PO TABS: 500.0000 mg | ORAL_TABLET | Freq: Two times a day (BID) | ORAL | 0 refills | Status: DC

## 2017-10-23 MED ORDER — METRONIDAZOLE 500 MG PO TABS
500.0000 mg | ORAL_TABLET | Freq: Once | ORAL | Status: AC
  Administered 2017-10-23: 500 mg via ORAL
  Filled 2017-10-23: qty 1

## 2017-10-23 MED ORDER — FLUOXETINE HCL 20 MG PO TABS: 20.0000 mg | ORAL_TABLET | Freq: Two times a day (BID) | ORAL | 3 refills | Status: DC

## 2017-10-23 NOTE — ED Triage Notes (Addendum)
Patient c/o vaginal bleeding. Per patient bleeding every day since January. Patient placed on hormones by OB/GYN in which she stopped last week because it has not helped the bleeding. Patient was scheduled for ablation in June but couldn't have surgery due to finances. Patient states quit her job x3 months and has had increase in depression in which she takes Prozac. Patient requesting to be seen for increase in depression as well due to panic attack Thursday. Denies any SI or HI.

## 2017-10-23 NOTE — ED Provider Notes (Signed)
[facility] EMERGENCY DEPARTMENT Provider Note   CSN: [id number] Arrival date & time: 10/23/17  1750     History   Chief Complaint Chief Complaint  Patient presents with  . Vaginal Bleeding    HPI Tara Woods is a 42 y.o. female.  Pt presents to the ED today with vaginal bleeding.  She said she's had vaginal bleeding every day since January.  The pt said she did see obgyn and was put on provera 10 mg bid.  She said that did not work, so she was scheduled for an ablation.  Unfortunately, due to losing her job, she lost her insurance, so was unable to get that done.  The pt said she has not been able to find a job and still does not have insurance.   She also has a hx of depression, and feels like that is getting worse.  She does not usually have to take her Xanax, but has been taking it more often.  She denies si/hi.  A man she was seeing messed her up emotionally and her daughter left for college.  Pt requests an increase in dosage for her prozac because that made her feel better in the past.  Pt used to see a counselor, but due to lack of insurance that has stopped as well.     Past Medical History:  Diagnosis Date  . Depression   . Dysmenorrhea     There are no active problems to display for this patient.   Past Surgical History:  Procedure Laterality Date  . CHOLECYSTECTOMY    . HERNIA REPAIR    . TUBAL LIGATION       OB History   None      Home Medications    Prior to Admission medications   Medication Sig Start Date End Date Taking? Authorizing Provider  ALPRAZolam (XANAX) 0.25 MG tablet Take 0.025 mg by mouth daily as needed for anxiety.  05/01/16  Yes [provider]  FLUoxetine (PROZAC) 20 MG capsule Take 20 mg by mouth daily. 10/07/17  Yes [provider]  FLUoxetine (PROZAC) 20 MG tablet Take 1 tablet (20 mg total) by mouth 2 (two) times daily. 10/23/17   Isla Pence, MD  metroNIDAZOLE (FLAGYL) 500 MG tablet Take 1 tablet (500 mg  total) by mouth 2 (two) times daily. 10/23/17   Isla Pence, MD    Family History No family history on file.  Social History Social History   Tobacco Use  . Smoking status: Current Every Day Smoker    Types: Cigarettes  . Smokeless tobacco: Never Used  Substance Use Topics  . Alcohol use: No  . Drug use: No     Allergies   Amoxapine and related and Penicillins   Review of Systems Review of Systems  Genitourinary: Positive for vaginal bleeding.  Psychiatric/Behavioral: Positive for dysphoric mood. Negative for suicidal ideas. The patient is nervous/anxious.   All other systems reviewed and are negative.    Physical Exam Updated Vital Signs BP (!) 153/80 (BP Location: Right Arm)   Pulse 91   Temp 98.7 F (37.1 C) (Oral)   Resp 18   Ht 5\' 4"  (1.626 m)   Wt 79.4 kg   LMP 01/09/2017   SpO2 97%   BMI 30.04 kg/m   Physical Exam  Constitutional: She is oriented to person, place, and time. She appears well-developed and well-nourished.  HENT:  Head: Normocephalic and atraumatic.  Right Ear: External ear normal.  Left Ear: External  ear normal.  Nose: Nose normal.  Mouth/Throat: Oropharynx is clear and moist.  Eyes: Pupils are equal, round, and reactive to light. Conjunctivae and EOM are normal.  Neck: Normal range of motion. Neck supple.  Cardiovascular: Normal rate, regular rhythm, normal heart sounds and intact distal pulses.  Pulmonary/Chest: Effort normal and breath sounds normal.  Abdominal: Soft. Bowel sounds are normal.  Genitourinary: Uterus is tender. Cervix exhibits no motion tenderness and no discharge. Right adnexum displays tenderness. Left adnexum displays tenderness. There is bleeding in the vagina.  Musculoskeletal: Normal range of motion.  Neurological: She is alert and oriented to person, place, and time.  Skin: Skin is warm. Capillary refill takes less than 2 seconds.  Psychiatric: She has a normal mood and affect. Her behavior is normal.  Judgment and thought content normal.  Nursing note and vitals reviewed.    ED Treatments / Results  Labs (all labs ordered are listed, but only abnormal results are displayed) Labs Reviewed  WET PREP, GENITAL - Abnormal; Notable for the following components:      Result Value   Clue Cells Wet Prep HPF POC PRESENT (*)    WBC, Wet Prep HPF POC FEW (*)    All other components within normal limits  BASIC METABOLIC PANEL - Abnormal; Notable for the following components:   Potassium 3.4 (*)    Calcium 8.8 (*)    All other components within normal limits  CBC WITH DIFFERENTIAL/PLATELET - Abnormal; Notable for the following components:   WBC 10.9 (*)    Platelets 441 (*)    All other components within normal limits  URINALYSIS, ROUTINE W REFLEX MICROSCOPIC - Abnormal; Notable for the following components:   APPearance CLOUDY (*)    Hgb urine dipstick LARGE (*)    Protein, ur 30 (*)    Bacteria, UA FEW (*)    All other components within normal limits  PREGNANCY, URINE  GC/CHLAMYDIA PROBE AMP ([place]) NOT AT [facility]    EKG None  Radiology No results found.  Procedures Procedures (including critical care time)  Medications Ordered in ED Medications  metroNIDAZOLE (FLAGYL) tablet 500 mg (has no administration in time range)     Initial Impression / Assessment and Plan / ED Course  I have reviewed the triage vital signs and the nursing notes.  Pertinent labs & imaging results that were available during my care of the patient were reviewed by me and considered in my medical decision making (see chart for details).     Pt is not suicidal, so I don't think TTS will help.  Pt will be referred to outpatient psych.  I also increased her prozac to 20 mg bid as she said that has helped her in the past.  hgb is stable, bleeding is not excessive today.  She is given the number of the women[facility] clinic to f/u for the dub.  She knows to return if worse.  Final Clinical Impressions(s) /  ED Diagnoses   Final diagnoses:  BV (bacterial vaginosis)  Dysfunctional uterine bleeding  Depression, unspecified depression type    ED Discharge Orders         Ordered    FLUoxetine (PROZAC) 20 MG tablet  2 times daily     10/23/17 2158    metroNIDAZOLE (FLAGYL) 500 MG tablet  2 times daily     10/23/17 2158           Isla Pence, MD 10/23/17 2207

## 2017-10-25 LAB — GC/CHLAMYDIA PROBE AMP (~~LOC~~) NOT AT ARMC
CHLAMYDIA, DNA PROBE: NEGATIVE
NEISSERIA GONORRHEA: NEGATIVE

## 2019-04-05 ENCOUNTER — Emergency Department (HOSPITAL_COMMUNITY)
Admission: EM | Admit: 2019-04-05 | Discharge: 2019-04-05 | Disposition: A | Discharge status: Home / Self Care | Payer: Self-pay | Attending: Emergency Medicine | Admitting: Emergency Medicine

## 2019-04-05 ENCOUNTER — Emergency Department (HOSPITAL_COMMUNITY): Payer: Self-pay

## 2019-04-05 ENCOUNTER — Other Ambulatory Visit: Payer: Self-pay

## 2019-04-05 ENCOUNTER — Encounter (HOSPITAL_COMMUNITY): Payer: Self-pay

## 2019-04-05 DIAGNOSIS — M549 Dorsalgia, unspecified: Secondary | ICD-10-CM | POA: Insufficient documentation

## 2019-04-05 DIAGNOSIS — F17210 Nicotine dependence, cigarettes, uncomplicated: Secondary | ICD-10-CM | POA: Insufficient documentation

## 2019-04-05 DIAGNOSIS — R102 Pelvic and perineal pain: Secondary | ICD-10-CM

## 2019-04-05 DIAGNOSIS — N938 Other specified abnormal uterine and vaginal bleeding: Secondary | ICD-10-CM | POA: Insufficient documentation

## 2019-04-05 DIAGNOSIS — R1030 Lower abdominal pain, unspecified: Secondary | ICD-10-CM | POA: Insufficient documentation

## 2019-04-05 HISTORY — DX: Restless legs syndrome: G25.81

## 2019-04-05 LAB — CBC WITH DIFFERENTIAL/PLATELET
Abs Immature Granulocytes: 0.04 10*3/uL (ref 0.00–0.07)
Basophils Absolute: 0.1 10*3/uL (ref 0.0–0.1)
Basophils Relative: 1 %
Eosinophils Absolute: 0.2 10*3/uL (ref 0.0–0.5)
Eosinophils Relative: 2 %
HCT: 40.5 % (ref 36.0–46.0)
Hemoglobin: 12.7 g/dL (ref 12.0–15.0)
Immature Granulocytes: 0 %
Lymphocytes Relative: 29 %
Lymphs Abs: 3.7 10*3/uL (ref 0.7–4.0)
MCH: 27.6 pg (ref 26.0–34.0)
MCHC: 31.4 g/dL (ref 30.0–36.0)
MCV: 88 fL (ref 80.0–100.0)
Monocytes Absolute: 0.6 10*3/uL (ref 0.1–1.0)
Monocytes Relative: 5 %
Neutro Abs: 7.9 10*3/uL — ABNORMAL HIGH (ref 1.7–7.7)
Neutrophils Relative %: 63 %
Platelets: 404 10*3/uL — ABNORMAL HIGH (ref 150–400)
RBC: 4.6 MIL/uL (ref 3.87–5.11)
RDW: 14.6 % (ref 11.5–15.5)
WBC: 12.6 10*3/uL — ABNORMAL HIGH (ref 4.0–10.5)
nRBC: 0 % (ref 0.0–0.2)

## 2019-04-05 LAB — BASIC METABOLIC PANEL
Anion gap: 7 (ref 5–15)
BUN: 7 mg/dL (ref 6–20)
CO2: 24 mmol/L (ref 22–32)
Calcium: 8.9 mg/dL (ref 8.9–10.3)
Chloride: 108 mmol/L (ref 98–111)
Creatinine, Ser: 0.63 mg/dL (ref 0.44–1.00)
GFR calc Af Amer: >60 mL/min (ref >60)
GFR calc non Af Amer: >60 mL/min (ref >60)
Glucose, Bld: 89 mg/dL (ref 70–99)
Potassium: 3.8 mmol/L (ref 3.5–5.1)
Sodium: 139 mmol/L (ref 135–145)

## 2019-04-05 LAB — URINALYSIS, ROUTINE W REFLEX MICROSCOPIC
Bacteria, UA: NONE SEEN
Bilirubin Urine: NEGATIVE
Glucose, UA: NEGATIVE mg/dL
Ketones, ur: NEGATIVE mg/dL
Nitrite: NEGATIVE
Protein, ur: 30 mg/dL — AB
RBC / HPF: >50 RBC/hpf — ABNORMAL HIGH (ref 0–5)
Specific Gravity, Urine: 1.01 (ref 1.005–1.030)
pH: 6 (ref 5.0–8.0)

## 2019-04-05 LAB — WET PREP, GENITAL
Clue Cells Wet Prep HPF POC: NONE SEEN
Sperm: NONE SEEN
Trich, Wet Prep: NONE SEEN
Yeast Wet Prep HPF POC: NONE SEEN

## 2019-04-05 LAB — POC URINE PREG, ED: Preg Test, Ur: NEGATIVE

## 2019-04-05 MED ORDER — MEGESTROL ACETATE 40 MG PO TABS: 40.0000 mg | ORAL_TABLET | Freq: Three times a day (TID) | ORAL | 0 refills | Status: DC

## 2019-04-05 NOTE — Discharge Instructions (Addendum)
You were seen in the emergency department for heavy and irregular periods.  We checked your blood count and you are not anemic.  We reviewed your case with Dr. Glo Herring from GYN and he recommended a medication to help stop your bleeding.  It will be important that you schedule a follow-up appointment with [facility].  Return to the emergency department if any worsening or concerning symptoms.

## 2019-04-05 NOTE — ED Provider Notes (Signed)
[facility] EMERGENCY DEPARTMENT Provider Note   CSN: [id number] Arrival date & time: 04/05/19  1023     History Chief Complaint  Patient presents with  . Vaginal Bleeding    Tara Woods is a 44 y.o. female.  She said she started her regular period  February 6 but bled for 22 days instead of her typical 4 to 7 days.  She started bleeding again 2 days ago heavy with clots.  Pressure in her lower abdomen and back.  Not on any hormone therapy.  Does not have insurance or a GYN.  No fevers chills cough chest pain shortness of breath dizziness or syncope.  Not on any blood thinners.  The history is provided by the patient.  Vaginal Bleeding Quality:  Clots Severity:  Moderate Onset quality:  Sudden Duration:  3 days Timing:  Intermittent Progression:  Unchanged Chronicity:  Recurrent Menstrual history:  Irregular Possible pregnancy: no   Context: spontaneously   Relieved by:  Nothing Worsened by:  Nothing Ineffective treatments:  None tried Associated symptoms: abdominal pain and back pain   Associated symptoms: no dysuria, no fever and no nausea   Risk factors: no bleeding disorder        Past Medical History:  Diagnosis Date  . Depression   . Dysmenorrhea   . Restless leg syndrome     There are no problems to display for this patient.   Past Surgical History:  Procedure Laterality Date  . CHOLECYSTECTOMY    . HERNIA REPAIR    . TUBAL LIGATION       OB History   No obstetric history on file.     No family history on file.  Social History   Tobacco Use  . Smoking status: Current Every Day Smoker    Packs/day: 0.50    Types: Cigarettes  . Smokeless tobacco: Never Used  Substance Use Topics  . Alcohol use: No  . Drug use: No    Home Medications Prior to Admission medications   Medication Sig Start Date End Date Taking? Authorizing Provider  ALPRAZolam (XANAX) 0.25 MG tablet Take 0.025 mg by mouth daily as needed for anxiety.  05/01/16    [provider]  FLUoxetine (PROZAC) 20 MG capsule Take 20 mg by mouth daily. 10/07/17   [provider]  FLUoxetine (PROZAC) 20 MG tablet Take 1 tablet (20 mg total) by mouth 2 (two) times daily. 10/23/17   Isla Pence, MD  metroNIDAZOLE (FLAGYL) 500 MG tablet Take 1 tablet (500 mg total) by mouth 2 (two) times daily. 10/23/17   Isla Pence, MD    Allergies    Amoxapine and related and Penicillins  Review of Systems   Review of Systems  Constitutional: Negative for fever.  HENT: Negative for sore throat.   Eyes: Negative for visual disturbance.  Respiratory: Negative for shortness of breath.   Cardiovascular: Negative for chest pain.  Gastrointestinal: Positive for abdominal pain. Negative for nausea.  Genitourinary: Positive for vaginal bleeding. Negative for dysuria.  Musculoskeletal: Positive for back pain.  Skin: Negative for rash.  Neurological: Negative for headaches.    Physical Exam Updated Vital Signs BP 116/83 (BP Location: Right Arm)   Pulse (!) 105   Temp 98.8 F (37.1 C) (Oral)   Resp 18   Ht 5\' 4"  (1.626 m)   Wt 86.2 kg   LMP 04/03/2019   SpO2 99%   BMI 32.61 kg/m   Physical Exam Vitals and nursing note reviewed. Exam conducted  with a chaperone present.  Constitutional:      General: She is not in acute distress.    Appearance: She is well-developed.  HENT:     Head: Normocephalic and atraumatic.  Eyes:     Conjunctiva/sclera: Conjunctivae normal.  Cardiovascular:     Rate and Rhythm: Regular rhythm. Tachycardia present.     Heart sounds: No murmur.  Pulmonary:     Effort: Pulmonary effort is normal. No respiratory distress.     Breath sounds: Normal breath sounds.  Abdominal:     Palpations: Abdomen is soft.     Tenderness: There is no abdominal tenderness.  Genitourinary:    Comments: Pelvic exam done with patient's nurse as chaperone.  Normal external genitalia.  Speculum exam with some blood in the vault and a little  bit of clot cleaned with 2 scope pets.  GC and wet prep sent.  Bimanual exam some uterine tenderness no adnexal masses or tenderness appreciated. Musculoskeletal:        General: No deformity or signs of injury.     Cervical back: Neck supple.  Skin:    General: Skin is warm and dry.     Capillary Refill: Capillary refill takes less than 2 seconds.  Neurological:     General: No focal deficit present.     Mental Status: She is alert.     ED Results / Procedures / Treatments   Labs (all labs ordered are listed, but only abnormal results are displayed) Labs Reviewed  WET PREP, GENITAL - Abnormal; Notable for the following components:      Result Value   WBC, Wet Prep HPF POC FEW (*)    All other components within normal limits  CBC WITH DIFFERENTIAL/PLATELET - Abnormal; Notable for the following components:   WBC 12.6 (*)    Platelets 404 (*)    Neutro Abs 7.9 (*)    All other components within normal limits  URINALYSIS, ROUTINE W REFLEX MICROSCOPIC - Abnormal; Notable for the following components:   APPearance HAZY (*)    Hgb urine dipstick LARGE (*)    Protein, ur 30 (*)    Leukocytes,Ua MODERATE (*)    RBC / HPF >50 (*)    All other components within normal limits  BASIC METABOLIC PANEL  POC URINE PREG, ED  GC/CHLAMYDIA PROBE AMP ([place]) NOT AT [facility]    EKG None  Radiology US PELVIC COMPLETE W TRANSVAGINAL AND TORSION R/O  Result Date: 04/05/2019 CLINICAL DATA:  Pelvic pain and vaginal bleeding.  Dysmenorrhea. EXAM: TRANSABDOMINAL AND TRANSVAGINAL ULTRASOUND OF PELVIS DOPPLER ULTRASOUND OF OVARIES TECHNIQUE: Both transabdominal and transvaginal ultrasound examinations of the pelvis were performed. Transabdominal technique was performed for global imaging of the pelvis including uterus, ovaries, adnexal regions, and pelvic cul-de-sac. It was necessary to proceed with endovaginal exam following the transabdominal exam to visualize the uterus and endometrium. Color and  duplex Doppler ultrasound was utilized to evaluate blood flow to the ovaries. COMPARISON:  Pelvic ultrasound 06/21/2009 FINDINGS: Uterus Measurements: 11.0 x 5.2 x 4.7 cm = volume: 140 mL. Uterus is anteverted, uterine fundus is difficult to visualize given positioning. Questionable nonspecific soft tissue prominence of the cervix with vascularity. Endometrium Thickness: 6 mm, normal.  No focal abnormality visualized. Right ovary Measurements: 2.3 x 1.3 x 3.1 cm = volume: 5 mL. Normal appearance/no adnexal mass. Left ovary Measurements: 3.7 x 2.3 x 1.4 cm = volume: 6.1 mL. Normal appearance/no adnexal mass. Pulsed Doppler evaluation of both ovaries demonstrates normal  low-resistance arterial and venous waveforms. Other findings No abnormal free fluid. IMPRESSION: 1. Nonspecific ill-defined soft tissue prominence of the cervix with vascularity. No well-defined mass or fibroid. Significance is uncertain, recommend correlation with physical exam and up-to-date Pap smear. 2. Normal endometrial thickness. 3. Normal sonographic appearance of both ovaries with blood flow. No adnexal mass. Electronically Signed   By: Keith Rake M.D.   On: 04/05/2019 14:13    Procedures Procedures (including critical care time)  Medications Ordered in ED Medications - No data to display  ED Course  I have reviewed the triage vital signs and the nursing notes.  Pertinent labs & imaging results that were available during my care of the patient were reviewed by me and considered in my medical decision making (see chart for details).  Clinical Course as of Apr 05 1722  Wed Apr 05, 2019  1126 Differential includes pregnancy, ectopic, dysfunctional uterine bleeding, fibroids   [MB]  1428 Discussed with Dr. Glo Herring from [facility].  He recommends Megace 40 mg 3 times daily until bleeding stops and then once daily and follow-up with him in the clinic.   [MB]    Clinical Course User Index [MB] Hayden Rasmussen, MD   MDM  Rules/Calculators/A&P                       Final Clinical Impression(s) / ED Diagnoses Final diagnoses:  Dysfunctional uterine bleeding    Rx / DC Orders ED Discharge Orders         Ordered    megestrol (MEGACE) 40 MG tablet  3 times daily     04/05/19 1438           Hayden Rasmussen, MD 04/05/19 1725

## 2019-04-05 NOTE — ED Triage Notes (Signed)
Pt presents to ED with vaginal bleeding. Pt states she had started her period 2/6 and bleed 22 days then started again Monday and now having lots of pressure in vaginal area, passing clots and having heavy bleeding.

## 2019-04-06 LAB — GC/CHLAMYDIA PROBE AMP (~~LOC~~) NOT AT ARMC
Chlamydia: NEGATIVE
Neisseria Gonorrhea: NEGATIVE

## 2019-04-12 ENCOUNTER — Telehealth: Payer: Self-pay | Admitting: Obstetrics and Gynecology

## 2019-04-12 NOTE — Telephone Encounter (Signed)

## 2019-04-13 ENCOUNTER — Encounter: Payer: Self-pay | Admitting: Obstetrics and Gynecology

## 2019-04-13 ENCOUNTER — Ambulatory Visit (INDEPENDENT_AMBULATORY_CARE_PROVIDER_SITE_OTHER): Payer: Self-pay | Admitting: Obstetrics and Gynecology

## 2019-04-13 ENCOUNTER — Other Ambulatory Visit: Payer: Self-pay

## 2019-04-13 ENCOUNTER — Other Ambulatory Visit (HOSPITAL_COMMUNITY)
Admission: RE | Admit: 2019-04-13 | Discharge: 2019-04-13 | Disposition: A | Discharge status: Home / Self Care | Payer: Self-pay | Source: Ambulatory Visit | Attending: Obstetrics and Gynecology | Admitting: Obstetrics and Gynecology

## 2019-04-13 VITALS — BP 105/65 | HR 102 | Ht 64.0 in | Wt 192.2 lb

## 2019-04-13 DIAGNOSIS — Z01419 Encounter for gynecological examination (general) (routine) without abnormal findings: Secondary | ICD-10-CM | POA: Insufficient documentation

## 2019-04-13 DIAGNOSIS — Z1211 Encounter for screening for malignant neoplasm of colon: Secondary | ICD-10-CM

## 2019-04-13 DIAGNOSIS — N939 Abnormal uterine and vaginal bleeding, unspecified: Secondary | ICD-10-CM

## 2019-04-13 LAB — HEMOCCULT GUIAC POC 1CARD (OFFICE): Fecal Occult Blood, POC: NEGATIVE

## 2019-04-13 NOTE — Progress Notes (Signed)
[facility] Visit  @DATE @            Patient name: Tara Woods MRN [id number]  Date of birth: 23-Feb-1975  CC & HPI:  Tara Woods is a 44 y.o. female presenting today for ER follow-up for abnormal uterine bleeding, 22-day cycle  ROS:   Review of systems positive for 3 months of hot sweats, drenching to the point that she has to shower waking her up at night.  Pertinent History Reviewed:   Reviewed: Significant for family hx early menopause. Medical         Past Medical History:  Diagnosis Date  . Depression   . Dysmenorrhea   . Restless leg syndrome                               Surgical Hx:    Past Surgical History:  Procedure Laterality Date  . CHOLECYSTECTOMY    . HERNIA REPAIR    . TUBAL LIGATION     Medications: Reviewed & Updated - see associated section                       Current Outpatient Medications:  .  FLUoxetine (PROZAC) 20 MG tablet, Take 1 tablet (20 mg total) by mouth 2 (two) times daily., Disp: 60 tablet, Rfl: 3 .  gabapentin (NEURONTIN) 100 MG capsule, Take 1-3 capsules by mouth at bedtime., Disp: , Rfl:  .  megestrol (MEGACE) 40 MG tablet, Take 1 tablet (40 mg total) by mouth 3 (three) times daily. Until bleeding stops.  Then take once daily., Disp: 45 tablet, Rfl: 0   Social History: Reviewed -  reports that she has been smoking cigarettes. She has been smoking about 0.50 packs per day. She has never used smokeless tobacco.  Objective Findings:  Vitals: Blood pressure 105/65, pulse (!) 102, height 5\' 4"  (1.626 m), weight 192 lb 3.2 oz (87.2 kg), last menstrual period 04/03/2019.  PHYSICAL EXAMINATION General appearance - alert, well appearing, and in no distress, oriented to person, place, and time and normal appearing weight Mental status - alert, oriented to person, place, and time, normal mood, behavior, speech, dress, motor activity, and thought processes Chest - clear to auscultation, no wheezes, rales or rhonchi, symmetric  air entry Heart - normal rate and regular rhythm Abdomen - soft, nontender, nondistended, no masses or organomegaly Breasts -  Skin - normal coloration and turgor, no rashes, no suspicious skin lesions noted  PELVIC External genitalia -multiparous evidence of prior obstetric laceration to left of midline Vulva -lax posterior perineal support Vagina -normal secretions short vaginal length Cervix -multiparous  Uterus -moderate enlargement may be 8 weeks size mild tenderness on left suspect a small fibroid in lower uterine segment.  We discussed ultrasound but at this time we will focus on the irregular menses resolve that and consider ultrasound in the future Adnexa -no masses Wet [place] -  Rectal - guaiac negative stool obtained, rectocele noted to left of midline CLINICAL DATA:  Pelvic pain and vaginal bleeding.  Dysmenorrhea.  EXAM: TRANSABDOMINAL AND TRANSVAGINAL ULTRASOUND OF PELVIS  DOPPLER ULTRASOUND OF OVARIES  TECHNIQUE: Both transabdominal and transvaginal ultrasound examinations of the pelvis were performed. Transabdominal technique was performed for global imaging of the pelvis including uterus, ovaries, adnexal regions, and pelvic cul-de-sac.  It was necessary to proceed with endovaginal exam following the transabdominal exam to visualize the uterus and  endometrium. Color and duplex Doppler ultrasound was utilized to evaluate blood flow to the ovaries.  COMPARISON:  Pelvic ultrasound 06/21/2009  FINDINGS: Uterus  Measurements: 11.0 x 5.2 x 4.7 cm = volume: 140 mL. Uterus is anteverted, uterine fundus is difficult to visualize given positioning. Questionable nonspecific soft tissue prominence of the cervix with vascularity.  Endometrium  Thickness: 6 mm, normal.  No focal abnormality visualized.  Right ovary  Measurements: 2.3 x 1.3 x 3.1 cm = volume: 5 mL. Normal appearance/no adnexal mass.  Left ovary  Measurements: 3.7 x 2.3 x 1.4 cm =  volume: 6.1 mL. Normal appearance/no adnexal mass.  Pulsed Doppler evaluation of both ovaries demonstrates normal low-resistance arterial and venous waveforms.  Other findings  No abnormal free fluid.  IMPRESSION: 1. Nonspecific ill-defined soft tissue prominence of the cervix with vascularity. No well-defined mass or fibroid. Significance is uncertain, recommend correlation with physical exam and up-to-date Pap smear. 2. Normal endometrial thickness. 3. Normal sonographic appearance of both ovaries with blood flow. No adnexal mass.   Electronically Signed   By: Keith Rake M.D.   On: 04/05/2019 14:13   Assessment & Plan:   A:  1. Abnormal uterine bleeding 2. Perimenopausal symptoms  P:  1. Complete Megace  with drop withdrawal.  Menstrual period should occur upon completion of Megace 2. Review of systems in 2 months to see if bleeding has corrected. 3.

## 2019-04-13 NOTE — Addendum Note (Signed)
Addended by: Armond Hang on: 04/13/2019 11:21 AM   Modules accepted: Orders

## 2019-04-17 ENCOUNTER — Ambulatory Visit: Payer: Self-pay

## 2019-04-24 LAB — CYTOLOGY - PAP
Comment: NEGATIVE
Diagnosis: REACTIVE
High risk HPV: NEGATIVE

## 2019-04-25 NOTE — Progress Notes (Signed)
Negative HPV at age 44. Per ASCCP guidelines, Tara Woods is eligible for screening paps every 5 years, with no prior known ab normals in her file records.

## 2019-05-24 ENCOUNTER — Telehealth: Payer: Self-pay | Admitting: Obstetrics and Gynecology

## 2019-05-24 ENCOUNTER — Other Ambulatory Visit: Payer: Self-pay | Admitting: *Deleted

## 2019-05-24 MED ORDER — ESTRADIOL 0.1 MG/24HR TD PTTW: 1.0000 | MEDICATED_PATCH | TRANSDERMAL | 12 refills | Status: DC

## 2019-05-24 NOTE — Telephone Encounter (Signed)
Pt informed of recommendation of Vivelle Dot.  She is to keep appt to discuss response. Pt did not have a withdrawal bleed after Megace.

## 2019-06-14 ENCOUNTER — Ambulatory Visit: Payer: Self-pay | Admitting: Obstetrics and Gynecology

## 2019-07-12 ENCOUNTER — Encounter: Payer: Self-pay | Admitting: Obstetrics and Gynecology

## 2019-07-12 ENCOUNTER — Ambulatory Visit (INDEPENDENT_AMBULATORY_CARE_PROVIDER_SITE_OTHER): Payer: Self-pay | Admitting: Obstetrics and Gynecology

## 2019-07-12 VITALS — BP 116/77 | HR 96 | Ht 64.0 in | Wt 198.0 lb

## 2019-07-12 DIAGNOSIS — N951 Menopausal and female climacteric states: Secondary | ICD-10-CM

## 2019-07-12 MED ORDER — PROGESTERONE 200 MG PO CAPS: 200.0000 mg | ORAL_CAPSULE | Freq: Every day | ORAL | 2 refills | Status: DC

## 2019-07-12 MED ORDER — ESTRADIOL 0.05 MG/24HR TD PTTW: 1.0000 | MEDICATED_PATCH | TRANSDERMAL | 12 refills | Status: DC

## 2019-07-12 NOTE — Progress Notes (Addendum)
Patient ID: Tara Woods, female   DOB: [birth year], 44 y.o.   MRN: [id number]    [facility] [facility] Visit  @DATE @            Patient name: Tara Woods MRN [id number]  Date of birth: 03/29/1975  CC & HPI:  Tara Woods is a 44 y.o. female presenting today for a follow up regarding her hot flashes. She reached out via [place] on 05/02/2019 with complaints of hot flashes and night sweats every night. She was prescribed Vivelle Dot to try until her appointment today. Her hot flashes are just about gone. She had one bad one over the weekend. She does note that her irritability and anxiety have decreased since starting the patch. There is nothing in particular that causes her anxiety, she just struggles to sit still and sometimes cries for no reason.   Her periods have been irregular. She had a period last month, but that was the first one since March. She walks two miles everyday. The patient denies fever, chills or any other symptoms or complaints at this time.   ROS:  ROS  + anxiety + hot flashes, improving + irritibility - fever - chills All systems are negative except as noted in the HPI and PMH.   Pertinent History Reviewed:   Reviewed: Medical         Past Medical History:  Diagnosis Date  . Depression   . Dysmenorrhea   . Restless leg syndrome                               Surgical Hx:    Past Surgical History:  Procedure Laterality Date  . CHOLECYSTECTOMY    . HERNIA REPAIR    . TUBAL LIGATION     Medications: Reviewed & Updated - see associated section                       Current Outpatient Medications:  .  FLUoxetine (PROZAC) 20 MG tablet, Take 1 tablet (20 mg total) by mouth 2 (two) times daily., Disp: 60 tablet, Rfl: 3 .  gabapentin (NEURONTIN) 100 MG capsule, Take 1-3 capsules by mouth at bedtime., Disp: , Rfl:    Social History: Reviewed -  reports that she has been smoking cigarettes. She has been smoking about 0.50 packs per day. She has never  used smokeless tobacco.  Objective Findings:  Vitals: Blood pressure 116/77, pulse 96, height 5\' 4"  (1.626 m), weight 198 lb (89.8 kg), last menstrual period 06/08/2019.  PHYSICAL EXAMINATION General appearance - alert, well appearing, and in no distress, oriented to person, place, and time and overweight Mental status - alert, oriented to person, place, and time, normal mood, behavior, speech, dress, motor activity, and thought processes, affect appropriate to mood  PELVIC DEFERRED  Assessment & Plan:   A:  1. Vasomotor symptoms 2. Anxiety  P:  1. Reduce Vivelle dot dose to 0.5 2. Rx Prometrium 14 days q3 months F/U prn by tele-visit By signing my name below, I, De Burrs, attest that this documentation has been prepared under the direction and in the presence of Jonnie Kind, MD. Electronically Signed: De Burrs, Medical Scribe. 07/12/19. 4:17 PM.  I personally performed the services described in this documentation, which was SCRIBED in my presence. The recorded information has been reviewed and considered accurate. It has been edited as necessary during review. Angelyn Punt  Glo Herring, MD

## 2019-07-12 NOTE — Progress Notes (Signed)
Patient ID: Tara Woods, female   DOB: 1975-11-08, 44 y.o.   MRN: [id number]    [facility] Visit  @DATE @            Patient name: Tara Woods MRN [id number]  Date of birth: 11/05/1975  CC & HPI:  Tara Woods is a 44 y.o. female presenting today for a follow up regarding her hot flashes. She reached out via [place] on 05/02/2019 with complaints of hot flashes and night sweats every night. She was prescribed Vivelle Dot to try until her appointment today. Her hot flashes are just about gone. She had one bad one over the weekend. She does note that her irritability and anxiety have decreased since starting the patch. There is nothing in particular that causes her anxiety, she just struggles to sit still and sometimes cries for no reason.   Her periods have been irregular. She had a period last month, but that was the first one since March. She walks two miles everyday. The patient denies fever, chills or any other symptoms or complaints at this time.   ROS:  ROS  + anxiety + hot flashes, improving + irritibility - fever - chills All systems are negative except as noted in the HPI and PMH.   Pertinent History Reviewed:   Reviewed: Medical         Past Medical History:  Diagnosis Date  . Depression   . Dysmenorrhea   . Restless leg syndrome                               Surgical Hx:    Past Surgical History:  Procedure Laterality Date  . CHOLECYSTECTOMY    . HERNIA REPAIR    . TUBAL LIGATION     Medications: Reviewed & Updated - see associated section                       Current Outpatient Medications:  .  FLUoxetine (PROZAC) 20 MG tablet, Take 1 tablet (20 mg total) by mouth 2 (two) times daily., Disp: 60 tablet, Rfl: 3 .  gabapentin (NEURONTIN) 100 MG capsule, Take 1-3 capsules by mouth at bedtime., Disp: , Rfl:    Social History: Reviewed -  reports that she has been smoking cigarettes. She has been smoking about 0.50 packs per day. She has never  used smokeless tobacco.  Objective Findings:  Vitals: Blood pressure 116/77, pulse 96, height 5\' 4"  (1.626 m), weight 198 lb (89.8 kg), last menstrual period 06/08/2019.  PHYSICAL EXAMINATION General appearance - alert, well appearing, and in no distress, oriented to person, place, and time and overweight Mental status - alert, oriented to person, place, and time, normal mood, behavior, speech, dress, motor activity, and thought processes, affect appropriate to mood  PELVIC DEFERRED  Assessment & Plan:   A:  1. Vasomotor symptoms 2. Anxiety  P:  1. Reduce Vivelle dot dose to 0.5 2. Rx Prometrium 14 days q3 months F/U prn by tele-visit By signing my name below, I, De Burrs, attest that this documentation has been prepared under the direction and in the presence of Jonnie Kind, MD. Electronically Signed: De Burrs, Medical Scribe. 07/12/19. 4:17 PM.  I personally performed the services described in this documentation, which was SCRIBED in my presence. The recorded information has been reviewed and considered accurate. It has been edited as necessary during review. Angelyn Punt  Glo Herring, MD

## 2019-07-24 ENCOUNTER — Emergency Department (HOSPITAL_COMMUNITY): Payer: Self-pay

## 2019-07-24 ENCOUNTER — Other Ambulatory Visit: Payer: Self-pay

## 2019-07-24 ENCOUNTER — Encounter (HOSPITAL_COMMUNITY): Payer: Self-pay | Admitting: *Deleted

## 2019-07-24 ENCOUNTER — Emergency Department (HOSPITAL_COMMUNITY)
Admission: EM | Admit: 2019-07-24 | Discharge: 2019-07-24 | Disposition: A | Discharge status: Home / Self Care | Payer: Self-pay | Attending: Emergency Medicine | Admitting: Emergency Medicine

## 2019-07-24 DIAGNOSIS — F17210 Nicotine dependence, cigarettes, uncomplicated: Secondary | ICD-10-CM | POA: Insufficient documentation

## 2019-07-24 DIAGNOSIS — R112 Nausea with vomiting, unspecified: Secondary | ICD-10-CM | POA: Insufficient documentation

## 2019-07-24 DIAGNOSIS — R10811 Right upper quadrant abdominal tenderness: Secondary | ICD-10-CM | POA: Insufficient documentation

## 2019-07-24 DIAGNOSIS — R10813 Right lower quadrant abdominal tenderness: Secondary | ICD-10-CM | POA: Insufficient documentation

## 2019-07-24 DIAGNOSIS — R197 Diarrhea, unspecified: Secondary | ICD-10-CM | POA: Insufficient documentation

## 2019-07-24 DIAGNOSIS — R109 Unspecified abdominal pain: Secondary | ICD-10-CM

## 2019-07-24 LAB — CBC
HCT: 38.2 % (ref 36.0–46.0)
Hemoglobin: 12.1 g/dL (ref 12.0–15.0)
MCH: 27.4 pg (ref 26.0–34.0)
MCHC: 31.7 g/dL (ref 30.0–36.0)
MCV: 86.4 fL (ref 80.0–100.0)
Platelets: 432 10*3/uL — ABNORMAL HIGH (ref 150–400)
RBC: 4.42 MIL/uL (ref 3.87–5.11)
RDW: 15.1 % (ref 11.5–15.5)
WBC: 10.7 10*3/uL — ABNORMAL HIGH (ref 4.0–10.5)
nRBC: 0 % (ref 0.0–0.2)

## 2019-07-24 LAB — COMPREHENSIVE METABOLIC PANEL
ALT: 12 U/L (ref 0–44)
AST: 17 U/L (ref 15–41)
Albumin: 3.9 g/dL (ref 3.5–5.0)
Alkaline Phosphatase: 67 U/L (ref 38–126)
Anion gap: 8 (ref 5–15)
BUN: 6 mg/dL (ref 6–20)
CO2: 23 mmol/L (ref 22–32)
Calcium: 8.6 mg/dL — ABNORMAL LOW (ref 8.9–10.3)
Chloride: 107 mmol/L (ref 98–111)
Creatinine, Ser: 0.59 mg/dL (ref 0.44–1.00)
GFR calc Af Amer: >60 mL/min (ref >60)
GFR calc non Af Amer: >60 mL/min (ref >60)
Glucose, Bld: 92 mg/dL (ref 70–99)
Potassium: 3.6 mmol/L (ref 3.5–5.1)
Sodium: 138 mmol/L (ref 135–145)
Total Bilirubin: 0.4 mg/dL (ref 0.3–1.2)
Total Protein: 7.6 g/dL (ref 6.5–8.1)

## 2019-07-24 LAB — URINALYSIS, ROUTINE W REFLEX MICROSCOPIC
Bacteria, UA: NONE SEEN
Bilirubin Urine: NEGATIVE
Glucose, UA: NEGATIVE mg/dL
Ketones, ur: NEGATIVE mg/dL
Leukocytes,Ua: NEGATIVE
Nitrite: NEGATIVE
Protein, ur: NEGATIVE mg/dL
Specific Gravity, Urine: 1.004 — ABNORMAL LOW (ref 1.005–1.030)
pH: 7 (ref 5.0–8.0)

## 2019-07-24 LAB — LIPASE, BLOOD: Lipase: 27 U/L (ref 11–51)

## 2019-07-24 LAB — POC URINE PREG, ED: Preg Test, Ur: NEGATIVE

## 2019-07-24 MED ORDER — IOHEXOL 300 MG/ML  SOLN
100.0000 mL | Freq: Once | INTRAMUSCULAR | Status: AC | PRN
  Administered 2019-07-24: 100 mL via INTRAVENOUS

## 2019-07-24 MED ORDER — ONDANSETRON 4 MG PO TBDP
4.0000 mg | ORAL_TABLET | Freq: Three times a day (TID) | ORAL | 0 refills | Status: DC | PRN
Start: 2019-07-24 — End: 2021-03-13

## 2019-07-24 MED ORDER — MORPHINE SULFATE (PF) 4 MG/ML IV SOLN
4.0000 mg | Freq: Once | INTRAVENOUS | Status: AC
  Administered 2019-07-24: 4 mg via INTRAVENOUS
  Filled 2019-07-24: qty 1

## 2019-07-24 MED ORDER — SODIUM CHLORIDE 0.9% FLUSH: 3.0000 mL | Freq: Once | INTRAVENOUS | Status: DC

## 2019-07-24 NOTE — ED Triage Notes (Signed)
Pt states she woke up in the middle of the night with a "pinching" pain to her right upper abdomen; pt states the pain has progressively gotten worse throughout the day; pt states she has been dry heaving; pt states the pain is worse with movement

## 2019-07-24 NOTE — ED Provider Notes (Signed)
[facility] Provider Note   CSN: [id number] Arrival date & time: 07/24/19  1428     History Chief Complaint  Patient presents with  . Abdominal Pain    Tara Woods is a 44 y.o. female who presents with right-sided abdominal pain.  Patient states that she woke up at 3 AM due to abdominal pain.  It felt like a twisting dull ache at the time.  She got up to go to the bathroom and went back to bed hoping that it would go away.  She woke up this morning and she states the pain was still present but it was a "pinching" pain.  She states that she was able to go to work but did not feel well.  She had nausea and several episodes of dry heaves.  She has been able to hold down water.  She denies fever, chills, chest pain, shortness of breath.  She has chronic loose stools to her prior cholecystectomy but states it was more watery than normal.  There is no blood in the stool.  She denies any urinary or vaginal symptoms.  She also reports having history of tubal ligation and hernia repair surgery in the past.  She has never had pain like this before.  HPI     Past Medical History:  Diagnosis Date  . Depression   . Dysmenorrhea   . Restless leg syndrome     There are no problems to display for this patient.   Past Surgical History:  Procedure Laterality Date  . CHOLECYSTECTOMY    . HERNIA REPAIR    . TUBAL LIGATION       OB History    Gravida  3   Para      Term      Preterm      AB      Living  3     SAB      TAB      Ectopic      Multiple      Live Births  3           Family History  Problem Relation Age of Onset  . Heart disease Father   . Seizures Father   . Stroke Father   . COPD Father   . Cancer Father        Lung  . Hypertension Father   . Dementia Father   . Heart murmur Daughter     Social History   Tobacco Use  . Smoking status: Current Every Day Smoker    Packs/day: 0.50    Types: Cigarettes  . Smokeless  tobacco: Never Used  Vaping Use  . Vaping Use: Never used  Substance Use Topics  . Alcohol use: Yes    Comment: socially  . Drug use: No    Home Medications Prior to Admission medications   Medication Sig Start Date End Date Taking? Authorizing Provider  estradiol (VIVELLE-DOT) 0.05 MG/24HR patch Place 1 patch (0.05 mg total) onto the skin 2 (two) times a week. 07/13/19  Yes Jonnie Kind, MD  FLUoxetine (PROZAC) 20 MG tablet Take 1 tablet (20 mg total) by mouth 2 (two) times daily. 10/23/17  Yes Isla Pence, MD  gabapentin (NEURONTIN) 100 MG capsule Take 100-300 capsules by mouth at bedtime as needed (for pain).  10/21/18  Yes [provider]  progesterone (PROMETRIUM) 200 MG capsule Take 1 capsule (200 mg total) by mouth daily. Use daily x 14 days after  3 months without menstrual bleeding. 07/12/19   Jonnie Kind, MD    Allergies    Amoxapine and related and Penicillins  Review of Systems   Review of Systems  Constitutional: Negative for chills and fever.  Respiratory: Negative for shortness of breath.   Cardiovascular: Negative for chest pain.  Gastrointestinal: Positive for abdominal pain, diarrhea, nausea and vomiting.  Genitourinary: Negative for dysuria, frequency, pelvic pain, vaginal bleeding and vaginal discharge.  All other systems reviewed and are negative.   Physical Exam Updated Vital Signs BP 138/86 (BP Location: Right Arm)   Pulse 88   Temp 98.9 F (37.2 C) (Oral)   Resp 18   Ht 5\' 4"  (1.626 m)   Wt 90.7 kg   LMP 06/08/2019   SpO2 96%   BMI 34.33 kg/m   Physical Exam Vitals and nursing note reviewed.  Constitutional:      General: She is not in acute distress.    Appearance: Normal appearance. She is well-developed. She is not ill-appearing.  HENT:     Head: Normocephalic and atraumatic.  Eyes:     General: No scleral icterus.       Right eye: No discharge.        Left eye: No discharge.     Conjunctiva/sclera: Conjunctivae  normal.     Pupils: Pupils are equal, round, and reactive to light.  Cardiovascular:     Rate and Rhythm: Normal rate and regular rhythm.  Pulmonary:     Effort: Pulmonary effort is normal. No respiratory distress.     Breath sounds: Normal breath sounds.  Abdominal:     General: There is no distension.     Palpations: Abdomen is soft.     Tenderness: There is abdominal tenderness (Right upper quadrant but more significant tenderness in the right lower quadrant). There is right CVA tenderness (Mild).  Musculoskeletal:     Cervical back: Normal range of motion.  Skin:    General: Skin is warm and dry.  Neurological:     Mental Status: She is alert and oriented to person, place, and time.  Psychiatric:        Behavior: Behavior normal.     ED Results / Procedures / Treatments   Labs (all labs ordered are listed, but only abnormal results are displayed) Labs Reviewed  COMPREHENSIVE METABOLIC PANEL - Abnormal; Notable for the following components:      Result Value   Calcium 8.6 (*)    All other components within normal limits  CBC - Abnormal; Notable for the following components:   WBC 10.7 (*)    Platelets 432 (*)    All other components within normal limits  URINALYSIS, ROUTINE W REFLEX MICROSCOPIC - Abnormal; Notable for the following components:   Color, Urine STRAW (*)    Specific Gravity, Urine 1.004 (*)    Hgb urine dipstick SMALL (*)    All other components within normal limits  LIPASE, BLOOD  POC URINE PREG, ED    EKG None  Radiology No results found.  Procedures Procedures (including critical care time)  Medications Ordered in ED Medications  morphine 4 MG/ML injection 4 mg (4 mg Intravenous Given 07/24/19 1738)  iohexol (OMNIPAQUE) 300 MG/ML solution 100 mL (100 mLs Intravenous Contrast Given 07/24/19 1752)    ED Course  I have reviewed the triage vital signs and the nursing notes.  Pertinent labs & imaging results that were available during my care  of the patient were reviewed by me  and considered in my medical decision making (see chart for details).  44 year old female presents with R sided abdominal pain since yesterday. Her vitals are normal. She is calm and comfortable appearing.  DDx includes biliary (colic, cholecystitis, cholangitis, choledocholithiasis), SBO, gastroenteritis, pyelo, kidney stone, appy, UTI, ovarian cyst. Will obtain labs, UA  Labs are overall reassuring. UA has small amount of hgb but otherwise is normal. CT obtained is negative for any acute process. Pain is controlled here and she has tolerated PO. Unclear etiology of symptoms. Could be viral with N/V and loose stools. Pt given reassurance and advised to return for worsening symptoms.  MDM Rules/Calculators/A&P                           Final Clinical Impression(s) / ED Diagnoses Final diagnoses:  Abdominal pain, unspecified abdominal location    Rx / DC Orders ED Discharge Orders    None       Recardo Evangelist, PA-C 07/31/19 0741    Noemi Chapel, MD 08/03/19 [phone]

## 2019-07-24 NOTE — Discharge Instructions (Signed)
Take Zofran as needed for nausea Take Tylenol as needed for pain or use a heating pad on the area Please return for any worsening symptoms

## 2019-09-26 ENCOUNTER — Other Ambulatory Visit: Payer: Self-pay | Admitting: Obstetrics and Gynecology

## 2019-09-26 MED ORDER — ESTRADIOL 0.1 MG/24HR TD PTTW: 1.0000 | MEDICATED_PATCH | TRANSDERMAL | 12 refills | Status: DC

## 2019-09-26 NOTE — Progress Notes (Signed)
vivelle Dot Rx switched back to the 0.1 mg/d dosing, as per pt preference.

## 2020-03-23 ENCOUNTER — Emergency Department (HOSPITAL_COMMUNITY)
Admission: EM | Admit: 2020-03-23 | Discharge: 2020-03-24 | Disposition: A | Discharge status: Home / Self Care | Payer: Commercial Managed Care - PPO | Attending: Emergency Medicine | Admitting: Emergency Medicine

## 2020-03-23 ENCOUNTER — Other Ambulatory Visit: Payer: Self-pay

## 2020-03-23 ENCOUNTER — Encounter (HOSPITAL_COMMUNITY): Payer: Self-pay

## 2020-03-23 DIAGNOSIS — N938 Other specified abnormal uterine and vaginal bleeding: Secondary | ICD-10-CM | POA: Diagnosis present

## 2020-03-23 DIAGNOSIS — N946 Dysmenorrhea, unspecified: Secondary | ICD-10-CM | POA: Insufficient documentation

## 2020-03-23 DIAGNOSIS — F17210 Nicotine dependence, cigarettes, uncomplicated: Secondary | ICD-10-CM | POA: Diagnosis not present

## 2020-03-23 DIAGNOSIS — R102 Pelvic and perineal pain: Secondary | ICD-10-CM | POA: Diagnosis not present

## 2020-03-23 DIAGNOSIS — Z79890 Hormone replacement therapy (postmenopausal): Secondary | ICD-10-CM | POA: Diagnosis not present

## 2020-03-23 DIAGNOSIS — N939 Abnormal uterine and vaginal bleeding, unspecified: Secondary | ICD-10-CM

## 2020-03-23 LAB — WET PREP, GENITAL
Clue Cells Wet Prep HPF POC: NONE SEEN
Sperm: NONE SEEN
Trich, Wet Prep: NONE SEEN
WBC, Wet Prep HPF POC: NONE SEEN
Yeast Wet Prep HPF POC: NONE SEEN

## 2020-03-23 LAB — URINALYSIS, ROUTINE W REFLEX MICROSCOPIC
Bacteria, UA: NONE SEEN
Bilirubin Urine: NEGATIVE
Glucose, UA: NEGATIVE mg/dL
Ketones, ur: 5 mg/dL — AB
Leukocytes,Ua: NEGATIVE
Nitrite: NEGATIVE
Protein, ur: 30 mg/dL — AB
RBC / HPF: >50 RBC/hpf — ABNORMAL HIGH (ref 0–5)
Specific Gravity, Urine: 1.021 (ref 1.005–1.030)
WBC, UA: >50 WBC/hpf — ABNORMAL HIGH (ref 0–5)
pH: 6 (ref 5.0–8.0)

## 2020-03-23 LAB — COMPREHENSIVE METABOLIC PANEL
ALT: 14 U/L (ref 0–44)
AST: 19 U/L (ref 15–41)
Albumin: 3.8 g/dL (ref 3.5–5.0)
Alkaline Phosphatase: 56 U/L (ref 38–126)
Anion gap: 7 (ref 5–15)
BUN: 8 mg/dL (ref 6–20)
CO2: 26 mmol/L (ref 22–32)
Calcium: 8.9 mg/dL (ref 8.9–10.3)
Chloride: 104 mmol/L (ref 98–111)
Creatinine, Ser: 0.68 mg/dL (ref 0.44–1.00)
GFR, Estimated: >60 mL/min (ref >60)
Glucose, Bld: 105 mg/dL — ABNORMAL HIGH (ref 70–99)
Potassium: 3.1 mmol/L — ABNORMAL LOW (ref 3.5–5.1)
Sodium: 137 mmol/L (ref 135–145)
Total Bilirubin: 0.3 mg/dL (ref 0.3–1.2)
Total Protein: 7.3 g/dL (ref 6.5–8.1)

## 2020-03-23 LAB — CBC WITH DIFFERENTIAL/PLATELET
Abs Immature Granulocytes: 0.03 10*3/uL (ref 0.00–0.07)
Basophils Absolute: 0.1 10*3/uL (ref 0.0–0.1)
Basophils Relative: 1 %
Eosinophils Absolute: 0.3 10*3/uL (ref 0.0–0.5)
Eosinophils Relative: 3 %
HCT: 35.4 % — ABNORMAL LOW (ref 36.0–46.0)
Hemoglobin: 11.3 g/dL — ABNORMAL LOW (ref 12.0–15.0)
Immature Granulocytes: 0 %
Lymphocytes Relative: 36 %
Lymphs Abs: 4.3 10*3/uL — ABNORMAL HIGH (ref 0.7–4.0)
MCH: 27.7 pg (ref 26.0–34.0)
MCHC: 31.9 g/dL (ref 30.0–36.0)
MCV: 86.8 fL (ref 80.0–100.0)
Monocytes Absolute: 0.9 10*3/uL (ref 0.1–1.0)
Monocytes Relative: 8 %
Neutro Abs: 6.3 10*3/uL (ref 1.7–7.7)
Neutrophils Relative %: 52 %
Platelets: 383 10*3/uL (ref 150–400)
RBC: 4.08 MIL/uL (ref 3.87–5.11)
RDW: 16.5 % — ABNORMAL HIGH (ref 11.5–15.5)
WBC Morphology: ABNORMAL
WBC: 11.9 10*3/uL — ABNORMAL HIGH (ref 4.0–10.5)
nRBC: 0 % (ref 0.0–0.2)

## 2020-03-23 LAB — PREGNANCY, URINE: Preg Test, Ur: NEGATIVE

## 2020-03-23 MED ORDER — MEGESTROL ACETATE 40 MG PO TABS
40.0000 mg | ORAL_TABLET | Freq: Every day | ORAL | 0 refills | Status: AC
Start: 2020-03-23 — End: 2020-03-30

## 2020-03-23 MED ORDER — POTASSIUM CHLORIDE ER 10 MEQ PO TBCR
10.0000 meq | EXTENDED_RELEASE_TABLET | Freq: Every day | ORAL | 0 refills | Status: DC
Start: 2020-03-23 — End: 2021-03-13

## 2020-03-23 NOTE — ED Provider Notes (Signed)
[name] EMERGENCY DEPARTMENT Provider Note   CSN: [id number] Arrival date & time: 03/23/20  2042     History Chief Complaint  Patient presents with  . Vaginal Bleeding    Tara Woods is a 45 y.o. female.  HPI Patient is a 45 year old female with a history of dysmenorrhea who presents the emergency department due to vaginal bleeding.  Patient is followed by Dr. Mallory Shirk with OB/GYN.  She is currently taking Vivelle dot patches for her history of vaginal bleeding.  She states since starting these patches 1 year ago she has had no symptoms until February 7.  She states for the past few weeks she has been experiencing daily, constant, mild vaginal bleeding.  She is soaking about 10 pads a day.  About 1 week ago her symptoms began to worsen and she began soaking about 12-13 pads per day.  Today she began experiencing some mild pelvic pain that she states is similar to menstrual cramps, and after talking to a friend who is a Marine scientist, she came to the emergency department for evaluation as well as to have her hemoglobin checked.  She reports feeling more fatigued than normal but otherwise has no complaints.  No lightheadedness, shortness of breath, chest pain, urinary changes.    Past Medical History:  Diagnosis Date  . Depression   . Dysmenorrhea   . Restless leg syndrome     There are no problems to display for this patient.   Past Surgical History:  Procedure Laterality Date  . CHOLECYSTECTOMY    . HERNIA REPAIR    . TUBAL LIGATION       OB History    Gravida  3   Para      Term      Preterm      AB      Living  3     SAB      IAB      Ectopic      Multiple      Live Births  3           Family History  Problem Relation Age of Onset  . Heart disease Father   . Seizures Father   . Stroke Father   . COPD Father   . Cancer Father        Lung  . Hypertension Father   . Dementia Father   . Heart murmur Daughter     Social History    Tobacco Use  . Smoking status: Current Every Day Smoker    Packs/day: 0.50    Types: Cigarettes  . Smokeless tobacco: Never Used  Vaping Use  . Vaping Use: Never used  Substance Use Topics  . Alcohol use: Yes    Comment: socially  . Drug use: No    Home Medications Prior to Admission medications   Medication Sig Start Date End Date Taking? Authorizing Provider  estradiol (VIVELLE-DOT) 0.1 MG/24HR patch Place 1 patch (0.1 mg total) onto the skin 2 (two) times a week. 09/28/19  Yes Jonnie Kind, MD  FLUoxetine (PROZAC) 20 MG tablet Take 1 tablet (20 mg total) by mouth 2 (two) times daily. 10/23/17  Yes Isla Pence, MD  megestrol (MEGACE) 40 MG tablet Take 1 tablet (40 mg total) by mouth daily for 7 days. 03/23/20 03/30/20 Yes Rayna Sexton, PA-C  potassium chloride (KLOR-CON) 10 MEQ tablet Take 1 tablet (10 mEq total) by mouth daily for 14 days. 03/23/20 04/06/20 Yes Rayna Sexton, PA-C  FLUoxetine (PROZAC) 40 MG capsule Take 20 mg by mouth in the morning and at bedtime.    [provider]  gabapentin (NEURONTIN) 100 MG capsule Take 100-300 capsules by mouth at bedtime as needed (for pain).  10/21/18   [provider]  ondansetron (ZOFRAN ODT) 4 MG disintegrating tablet Take 1 tablet (4 mg total) by mouth every 8 (eight) hours as needed for nausea or vomiting. 07/24/19   Recardo Evangelist, PA-C    Allergies    Amoxapine and related and Penicillins  Review of Systems   Review of Systems  All other systems reviewed and are negative. Ten systems reviewed and are negative for acute change, except as noted in the HPI.    Physical Exam Updated Vital Signs BP 120/71 (BP Location: Right Arm)   Pulse 96   Temp 98 F (36.7 C) (Oral)   Resp 18   Ht 5\' 4"  (1.626 m)   Wt 95.3 kg   LMP 08/15/2019 (Approximate)   SpO2 100%   BMI 36.05 kg/m   Physical Exam Vitals and nursing note reviewed.  Constitutional:      General: She is not in acute distress.     Appearance: Normal appearance. She is not ill-appearing, toxic-appearing or diaphoretic.  HENT:     Head: Normocephalic and atraumatic.     Right Ear: External ear normal.     Left Ear: External ear normal.     Nose: Nose normal.     Mouth/Throat:     Mouth: Mucous membranes are moist.     Pharynx: Oropharynx is clear. No oropharyngeal exudate or posterior oropharyngeal erythema.  Eyes:     General: No scleral icterus.       Right eye: No discharge.        Left eye: No discharge.     Extraocular Movements: Extraocular movements intact.     Conjunctiva/sclera: Conjunctivae normal.     Pupils: Pupils are equal, round, and reactive to light.     Comments: No pallor noted along the inferior palpebral conjunctiva.  Cardiovascular:     Rate and Rhythm: Normal rate and regular rhythm.     Pulses: Normal pulses.     Heart sounds: Normal heart sounds. No murmur heard. No friction rub. No gallop.   Pulmonary:     Effort: Pulmonary effort is normal. No respiratory distress.     Breath sounds: Normal breath sounds. No stridor. No wheezing, rhonchi or rales.  Abdominal:     General: Abdomen is flat.     Palpations: Abdomen is soft.     Tenderness: There is abdominal tenderness.     Comments: Mild tenderness noted along the suprapubic region.  Genitourinary:    Comments: Patient's female nurse is acting as Producer, television/film/video.  Normal-appearing vulvar anatomy with a small amount of dried blood noted.  Normal-appearing vaginal mucosa.  Large blood clot noted in the vaginal vault which was removed with jumbo swab.  Closed cervical os.  Small amount of cervical tenderness but no adnexal tenderness. Musculoskeletal:        General: Normal range of motion.     Cervical back: Normal range of motion and neck supple. No tenderness.     Right lower leg: No edema.     Left lower leg: No edema.  Skin:    General: Skin is warm and dry.  Neurological:     General: No focal deficit present.     Mental Status: She  is alert and oriented to  person, place, and time.  Psychiatric:        Mood and Affect: Mood normal.        Behavior: Behavior normal.    ED Results / Procedures / Treatments   Labs (all labs ordered are listed, but only abnormal results are displayed) Labs Reviewed  COMPREHENSIVE METABOLIC PANEL - Abnormal; Notable for the following components:      Result Value   Potassium 3.1 (*)    Glucose, Bld 105 (*)    All other components within normal limits  CBC WITH DIFFERENTIAL/PLATELET - Abnormal; Notable for the following components:   WBC 11.9 (*)    Hemoglobin 11.3 (*)    HCT 35.4 (*)    RDW 16.5 (*)    Lymphs Abs 4.3 (*)    All other components within normal limits  URINALYSIS, ROUTINE W REFLEX MICROSCOPIC - Abnormal; Notable for the following components:   APPearance CLOUDY (*)    Hgb urine dipstick LARGE (*)    Ketones, ur 5 (*)    Protein, ur 30 (*)    RBC / HPF >50 (*)    WBC, UA >50 (*)    All other components within normal limits  WET PREP, GENITAL  PREGNANCY, URINE  GC/CHLAMYDIA PROBE AMP (Page) NOT AT [facility]   EKG None  Radiology No results found.  Procedures Procedures   Medications Ordered in ED Medications - No data to display  ED Course  I have reviewed the triage vital signs and the nursing notes.  Pertinent labs & imaging results that were available during my care of the patient were reviewed by me and considered in my medical decision making (see chart for details).    MDM Rules/Calculators/A&P                          Pt is a 45 y.o. female with a history of dysmenorrhea who presents the emergency department due to vaginal bleeding for the past 3 weeks.  Labs: CBC with a hemoglobin of 11.3, leukocytosis of 11.9, hematocrit 35.4, RDW of 16.5. Wet prep is negative. UA showing large hemoglobin, 5 ketones, 30 protein, greater than 50 RBCs, greater than 50 WBCs.  I, Rayna Sexton, PA-C, personally reviewed and evaluated these images and  lab results as part of my medical decision-making.  Patient presents today with vaginal bleeding for the past few weeks.  Patient had a large blood clot in the vaginal vault on my pelvic exam but otherwise did not find any abnormalities.  Small amount of cervical tenderness but no adnexal tenderness.  Closed cervical os.  Hemoglobin mildly decreased at 11.3 from patient's baseline which appears to be between 12 and 13.  Patient has appointment in a few weeks with her gynecologist.  I spoke to Jeneen Rinks with our pharmacy team and he has reviewed her chart.  He feels that it is reasonable for her to start a short course of Megace as well.  He recommends 40 mg once daily for the next week.  He states this can be combined with her estradiol patch as well.  Patient also mildly hypokalemic at 3.1 today.  We will start patient on a low dose of potassium for the next 2 weeks.  Recommend that she follow-up with her PCP for recheck of her potassium level.  Discussed this plan with the patient and she is amenable.  Feel that she is stable for discharge and patient is agreeable.  She verbalized  understanding of the above plan.  Discussed return precautions in length.  Her questions were answered and she was amicable at the time of discharge.  Note: Portions of this report may have been transcribed using voice recognition software. Every effort was made to ensure accuracy; however, inadvertent computerized transcription errors may be present.   Final Clinical Impression(s) / ED Diagnoses Final diagnoses:  Vaginal bleeding   Rx / DC Orders ED Discharge Orders         Ordered    megestrol (MEGACE) 40 MG tablet  Daily        03/23/20 2310    potassium chloride (KLOR-CON) 10 MEQ tablet  Daily        03/23/20 2310           Rayna Sexton, PA-C 03/24/20 1724    Noemi Chapel, MD 03/26/20 1040

## 2020-03-23 NOTE — Discharge Instructions (Addendum)
Like we discussed, please continue using your estradiol patch as prescribed.  I have also given you a referral to another OB/GYN, Dr. Elly Modena, in [employer] if you would prefer to find a new gynecologist.  I prescribed you a short course of Megace.  You have taken this in the past for vaginal bleeding.  You can take this once a day for the next week which should hopefully help with your symptoms.  Lastly, I am going to prescribe you a short course of a potassium supplement.  Your potassium was mildly decreased today.  Please take this once a day for the next 2 weeks.  Please follow-up with your regular doctor and have your potassium levels rechecked.  If your symptoms worsen, please return to the emergency department immediately for reevaluation.  It was a pleasure to meet you.

## 2020-03-23 NOTE — ED Triage Notes (Signed)
Pt arrives via POV from home c/o bright red vaginal bleeding since Feb. 7th. Pt reports cramping began tonight and Pt reports passing large blood clots multiple times today as well. Pt reports bleeding is worse in evening with heavy saturation. Pt reports being hormone patch sing July of last year for this same complaint.

## 2020-03-25 LAB — GC/CHLAMYDIA PROBE AMP (~~LOC~~) NOT AT ARMC
Chlamydia: NEGATIVE
Comment: NEGATIVE
Comment: NORMAL
Neisseria Gonorrhea: NEGATIVE

## 2020-06-17 ENCOUNTER — Other Ambulatory Visit: Payer: Self-pay

## 2020-06-17 MED ORDER — ESTRADIOL 0.1 MG/24HR TD PTTW: 1.0000 | MEDICATED_PATCH | TRANSDERMAL | 12 refills | Status: DC

## 2020-09-05 IMAGING — US US PELVIS COMPLETE TRANSABD/TRANSVAG W DUPLEX
1 series · 13 of 25 positions shown · non-contrast
Comparison: Pelvic ultrasound 06/21/2009

CLINICAL DATA: Pelvic pain and vaginal bleeding.  Dysmenorrhea.

EXAM:
TRANSABDOMINAL AND TRANSVAGINAL ULTRASOUND OF PELVIS
DOPPLER ULTRASOUND OF OVARIES
TECHNIQUE: Both transabdominal and transvaginal ultrasound examinations of the
pelvis were performed. Transabdominal technique was performed for
global imaging of the pelvis including uterus, ovaries, adnexal
regions, and pelvic cul-de-sac.
It was necessary to proceed with endovaginal exam following the
transabdominal exam to visualize the uterus and endometrium. Color
and duplex Doppler ultrasound was utilized to evaluate blood flow to
the ovaries.

[Series 1: us pelvis complete transabd/transvag w duplex · 13 of 75 slices shown]
[im 1/75]
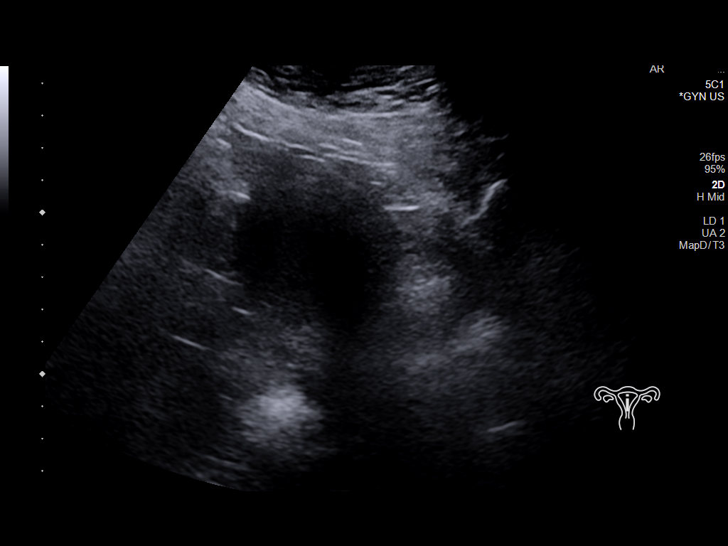
[im 7/75]
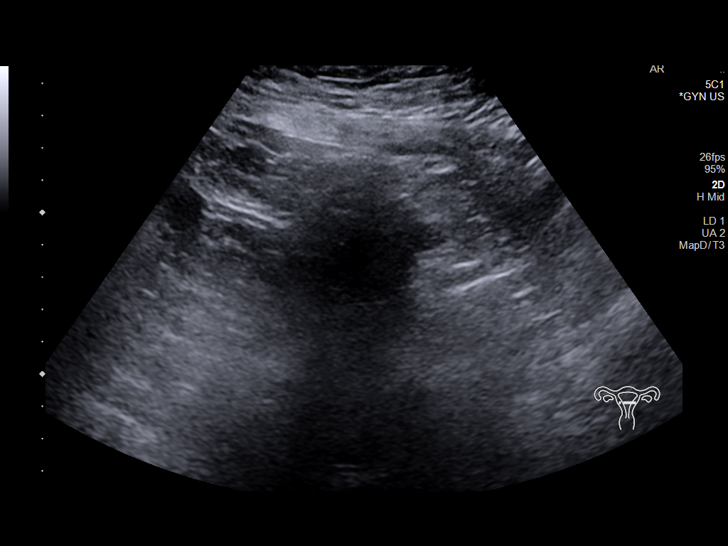
[im 13/75]
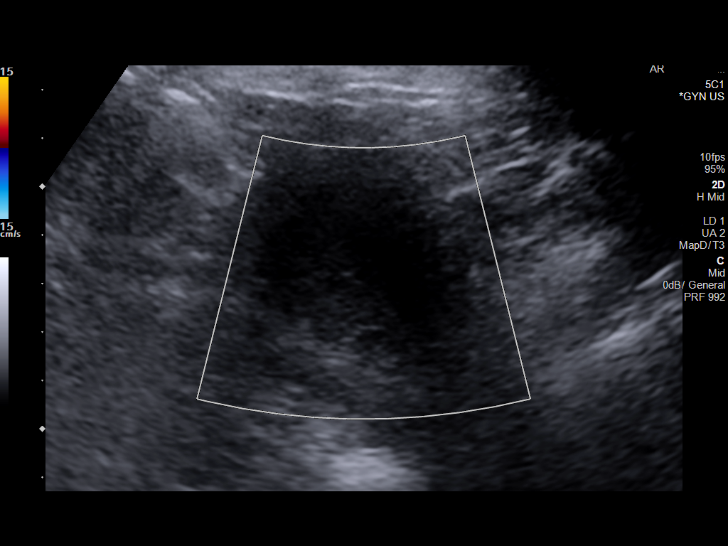
[im 19/75]
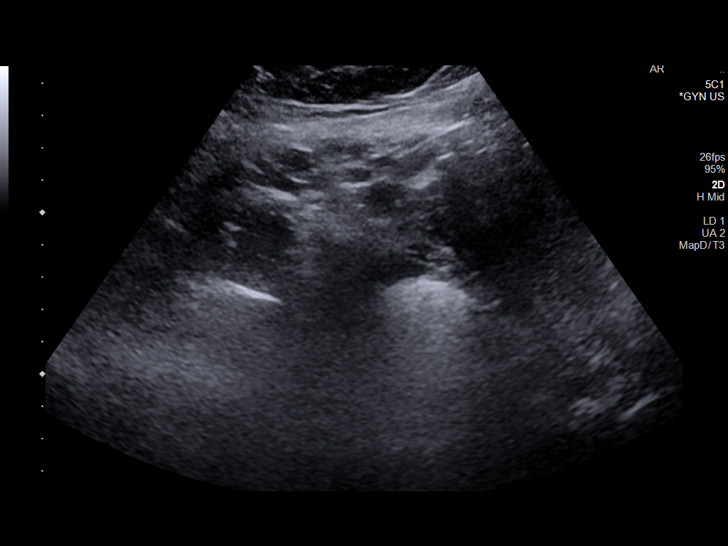
[im 25/75]
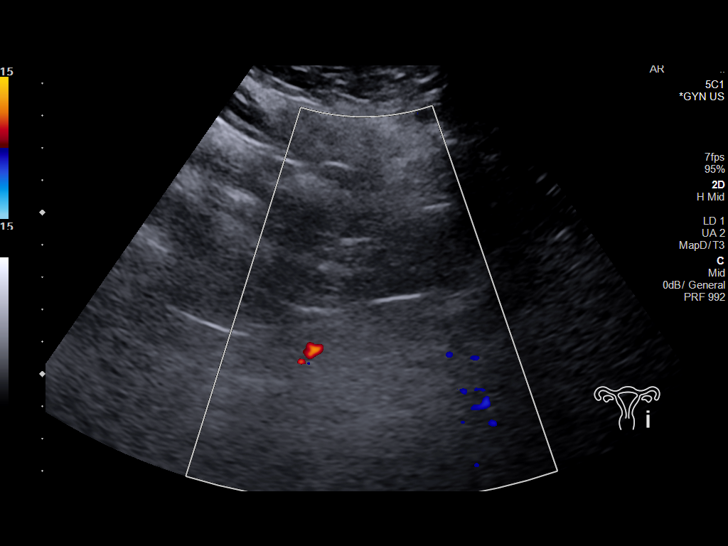
[im 31/75]
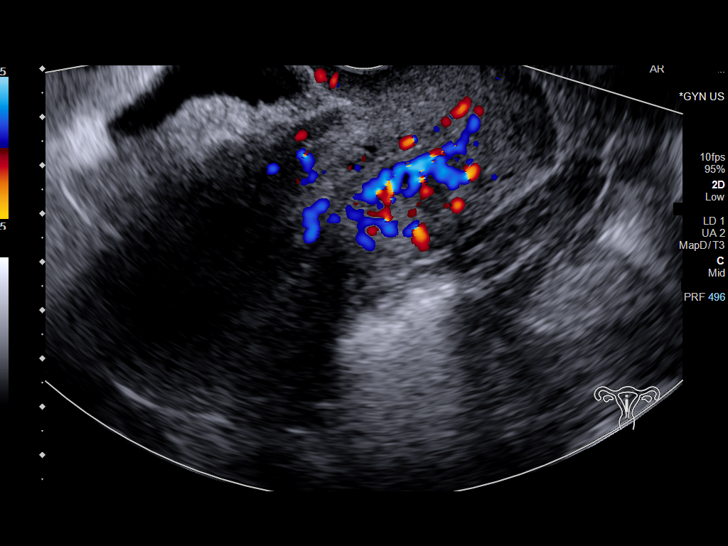
[im 38/75]
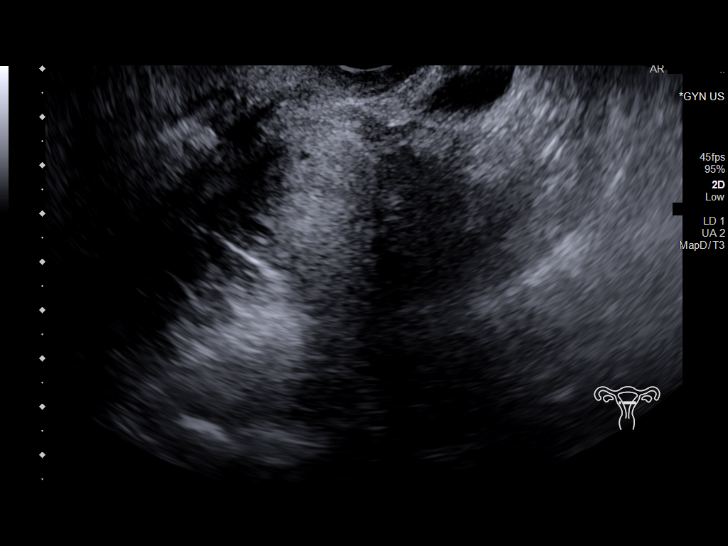
[im 44/75]
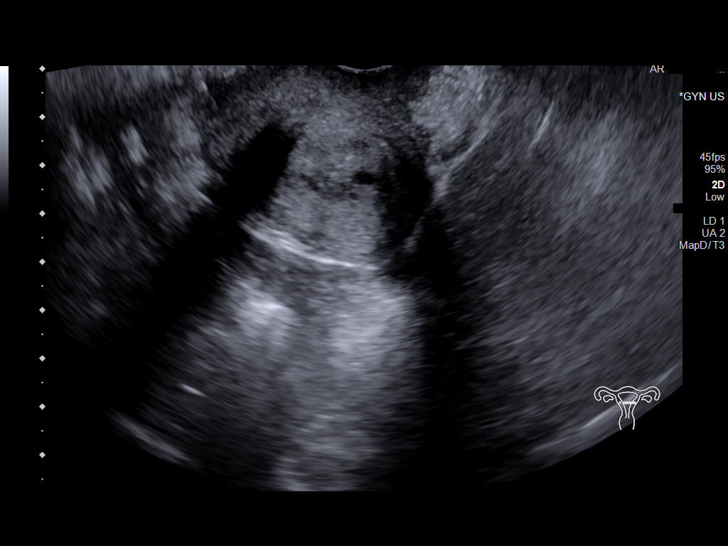
[im 50/75]
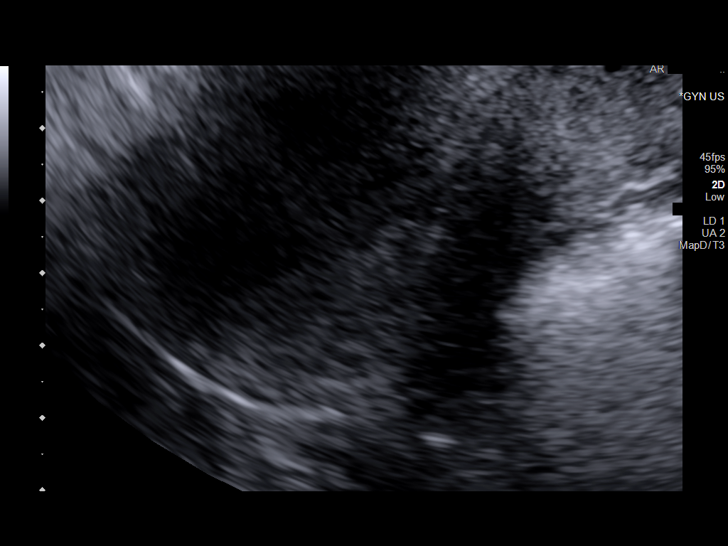
[im 56/75]
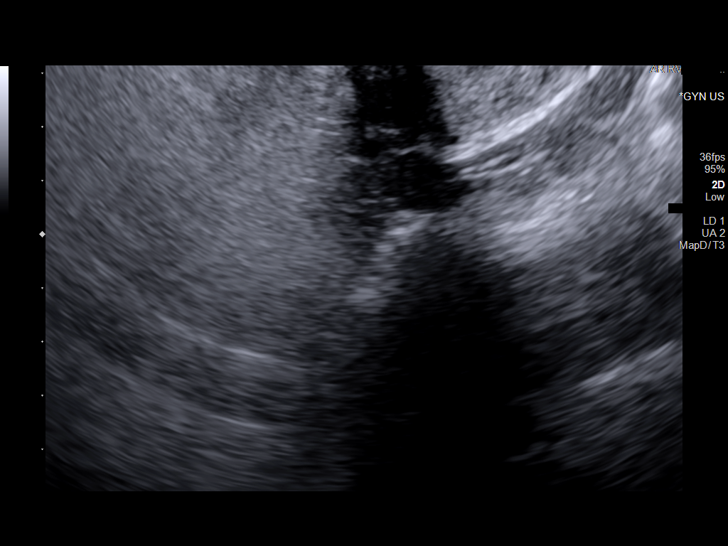
[im 62/75]
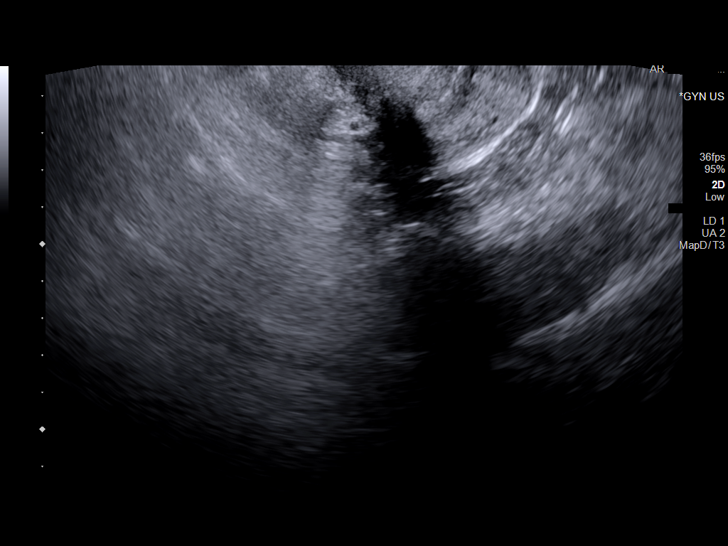
[im 68/75]
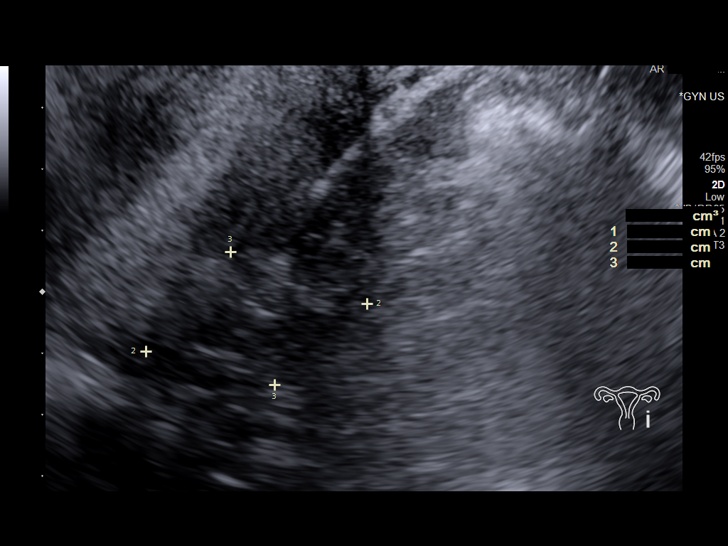
[im 75/75]
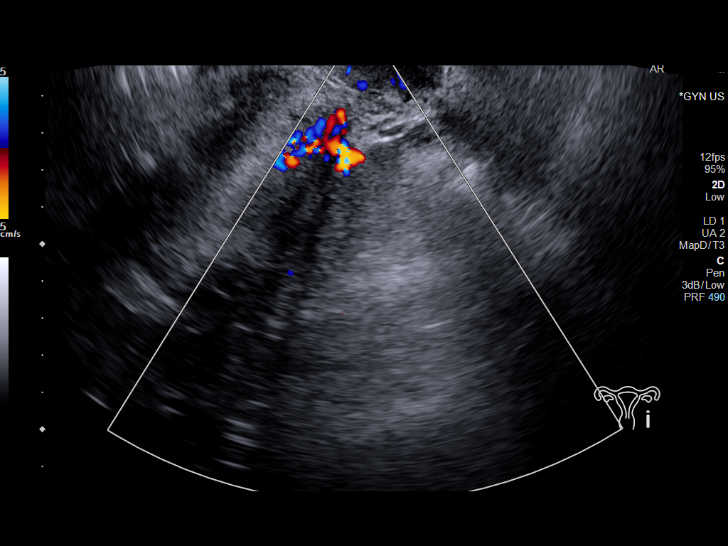

[13 of 25 positions shown; findings below may reference images not displayed]

FINDINGS: Uterus

Measurements: 11.0 x 5.2 x 4.7 cm = volume: 140 mL. Uterus is
anteverted, uterine fundus is difficult to visualize given
positioning. Questionable nonspecific soft tissue prominence of the
cervix with vascularity.

Endometrium

Thickness: 6 mm, normal.  No focal abnormality visualized.

Right ovary

Measurements: 2.3 x 1.3 x 3.1 cm = volume: 5 mL. Normal
appearance/no adnexal mass.

Left ovary

Measurements: 3.7 x 2.3 x 1.4 cm = volume: 6.1 mL. Normal
appearance/no adnexal mass.

Pulsed Doppler evaluation of both ovaries demonstrates normal
low-resistance arterial and venous waveforms.

Other findings

No abnormal free fluid.
IMPRESSION: 1. Nonspecific ill-defined soft tissue prominence of the cervix with
vascularity. No well-defined mass or fibroid. Significance is
uncertain, recommend correlation with physical exam and up-to-date
Pap smear.
2. Normal endometrial thickness.
3. Normal sonographic appearance of both ovaries with blood flow. No
adnexal mass.

## 2020-12-24 IMAGING — CT CT ABD-PELV W/ CM
2 of 6 series · 16 of 46 positions shown, 18 images · IV contrast (Omnipaque or Isovue)
Comparison: None.

CLINICAL DATA: Right lower quadrant pain

EXAM:
CT ABDOMEN AND PELVIS WITH CONTRAST
TECHNIQUE: Multidetector CT imaging of the abdomen and pelvis was performed
using the standard protocol following bolus administration of
intravenous contrast.
CONTRAST:  100mL OMNIPAQUE IOHEXOL 300 MG/ML  SOLN

[Series 2: axial st · axial · 0.67mm/px · z∈[+1002,+1426]mm · 13 of 99 slices shown, 15 images]
[im 7/99  soft-tissue]
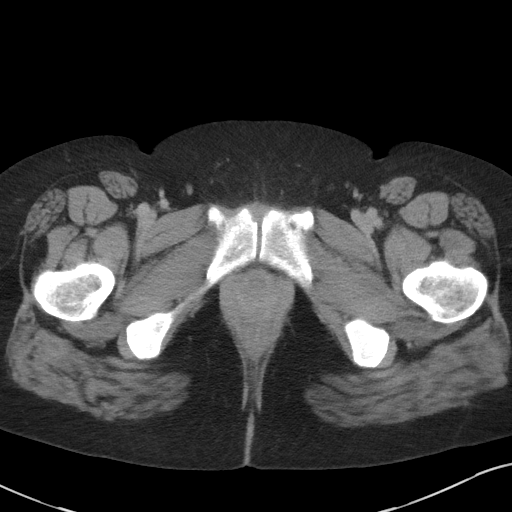
[im 7/99  bone]
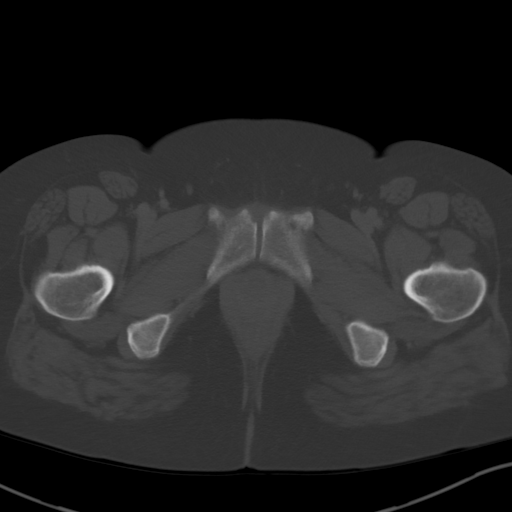
[im 13/99  soft-tissue]
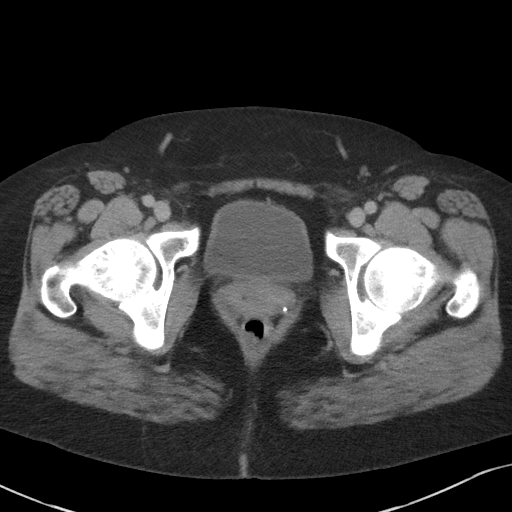
[im 19/99  soft-tissue]
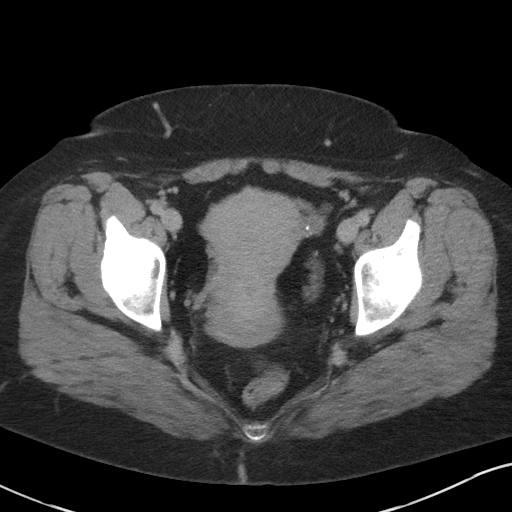
[im 31/99  soft-tissue]
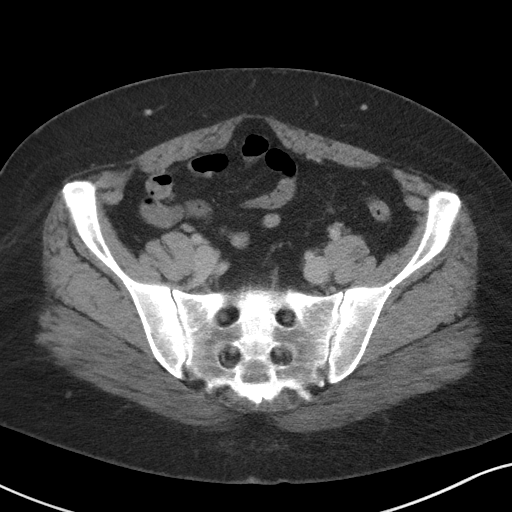
[im 37/99  soft-tissue]
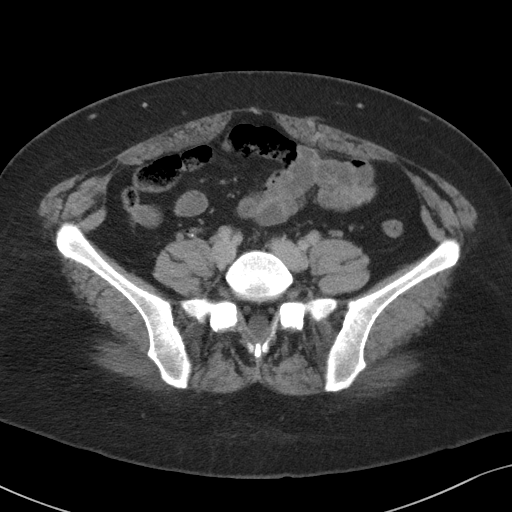
[im 43/99  soft-tissue]
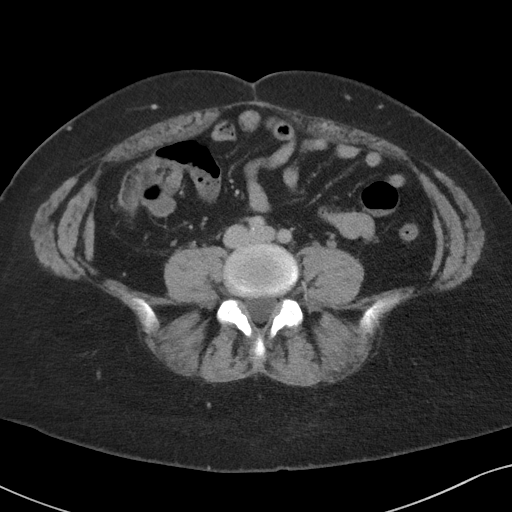
[im 50/99  soft-tissue]
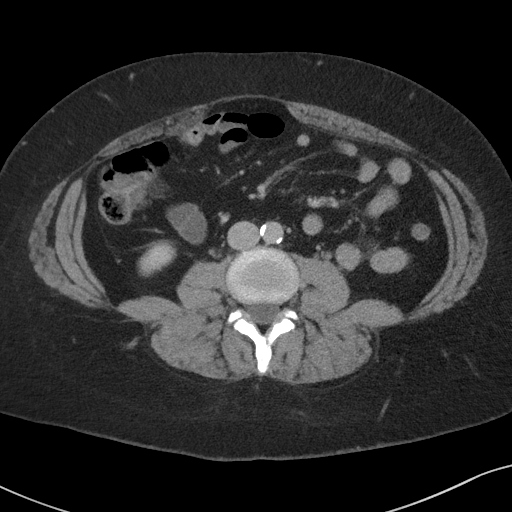
[im 56/99  soft-tissue]
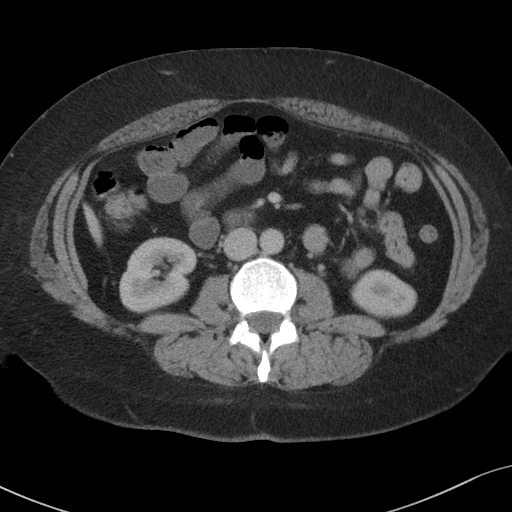
[im 62/99  soft-tissue]
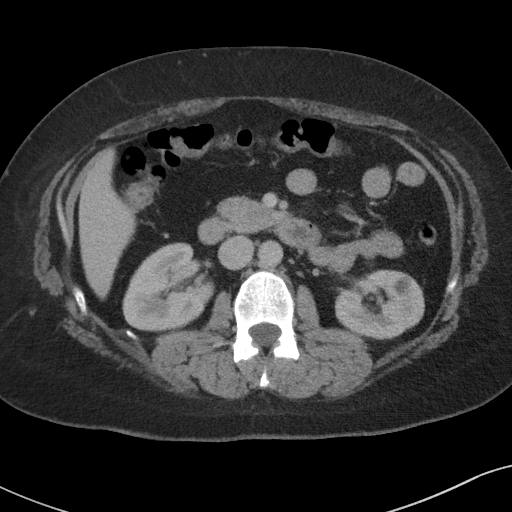
[im 62/99  bone]
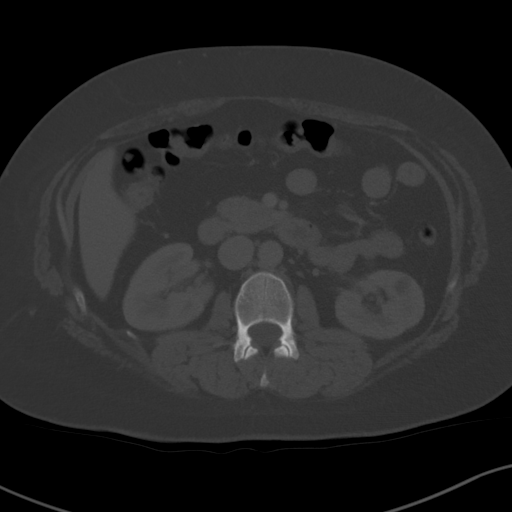
[im 68/99  soft-tissue]
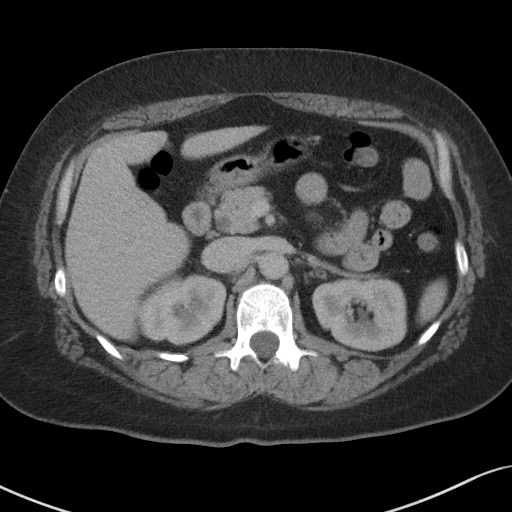
[im 80/99  soft-tissue]
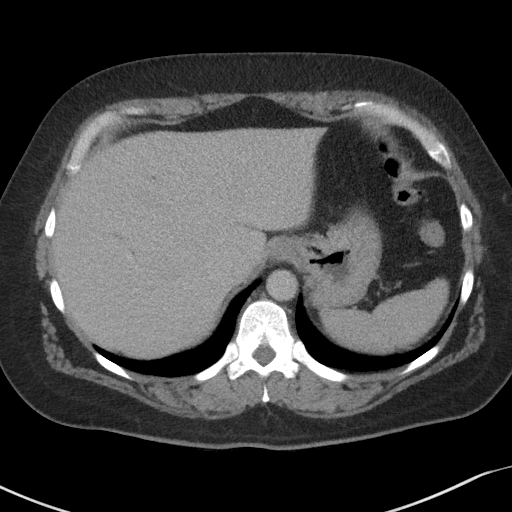
[im 86/99  soft-tissue]
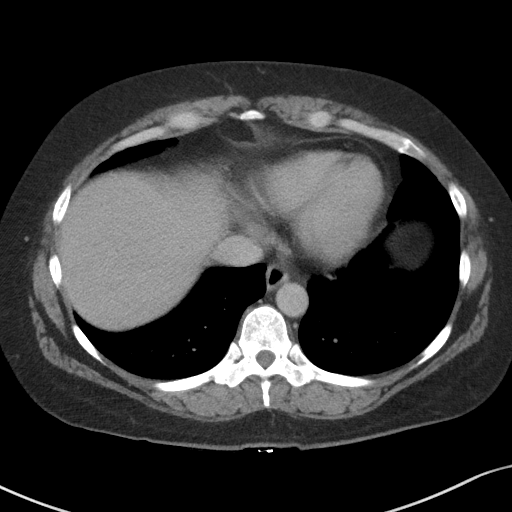
[im 92/99  soft-tissue]
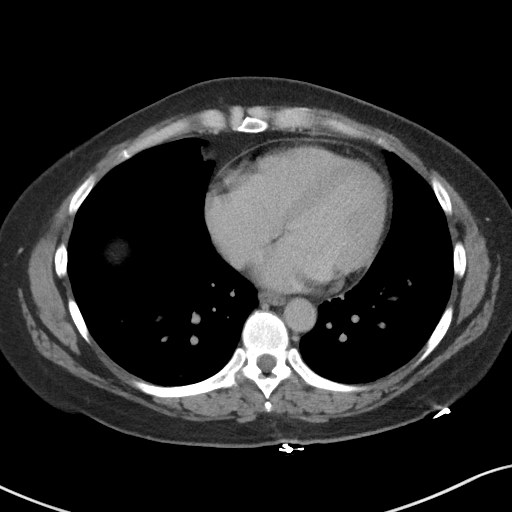

[Series 5: coronal st · coronal · 0.67mm/px · 3 of 85 slices shown]
[im 29/85  soft-tissue]
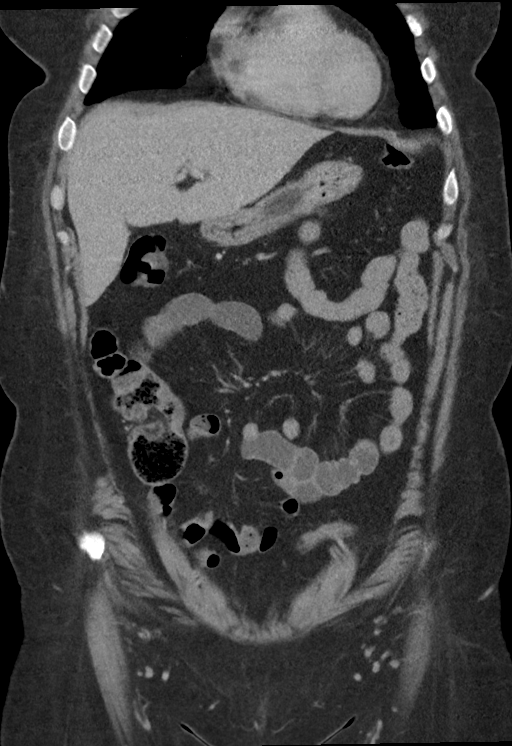
[im 38/85  soft-tissue]
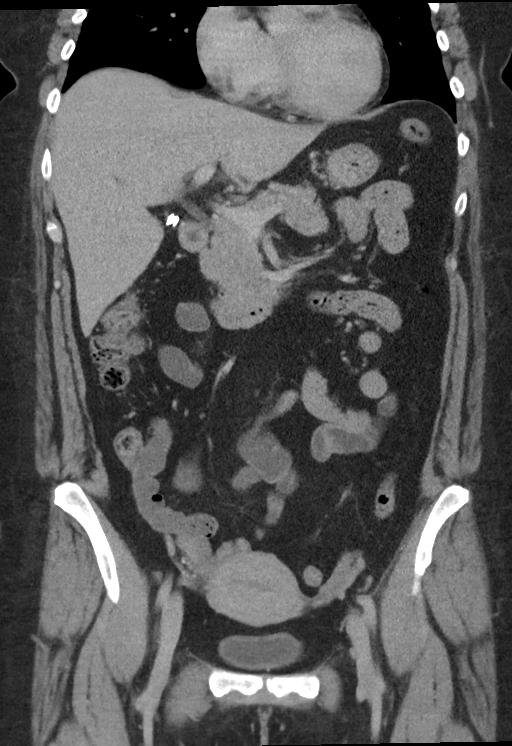
[im 47/85  soft-tissue]
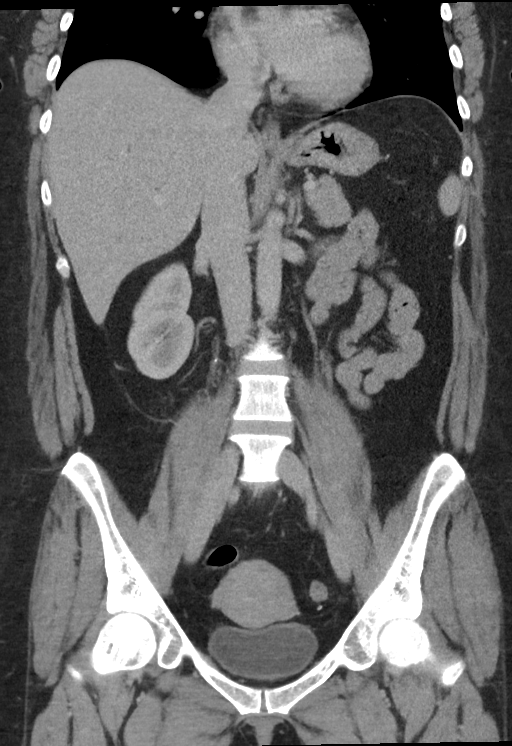

[16 of 46 positions shown; findings below may reference images not displayed]

FINDINGS: Lower chest: Lung bases are clear. No effusions. Heart is normal
size.

Hepatobiliary: No focal liver abnormality is seen. Status post
cholecystectomy. No biliary dilatation.

Pancreas: No focal abnormality or ductal dilatation.

Spleen: No focal abnormality.  Normal size.

Adrenals/Urinary Tract: Small cyst in the midpole of the left
kidney. No hydronephrosis. Adrenal glands and urinary bladder
unremarkable.

Stomach/Bowel: scattered colonic diverticula. Normal appendix.
Stomach and small bowel decompressed, unremarkable.

Vascular/Lymphatic: Aortic atherosclerosis. No enlarged abdominal or
pelvic lymph nodes.

Reproductive: Uterus and adnexa unremarkable.  No mass.

Other: No free fluid or free air.

Musculoskeletal: No acute bony abnormality.
IMPRESSION: Normal appendix.

Aortic atherosclerosis.

No acute findings in the abdomen or pelvis.

## 2021-03-13 ENCOUNTER — Encounter: Payer: Self-pay | Admitting: Obstetrics & Gynecology

## 2021-03-13 ENCOUNTER — Ambulatory Visit: Payer: Commercial Managed Care - PPO | Admitting: Obstetrics & Gynecology

## 2021-03-13 ENCOUNTER — Other Ambulatory Visit: Payer: Self-pay

## 2021-03-13 VITALS — BP 132/66 | HR 92 | Wt 204.0 lb

## 2021-03-13 DIAGNOSIS — N938 Other specified abnormal uterine and vaginal bleeding: Secondary | ICD-10-CM

## 2021-03-13 MED ORDER — MEGESTROL ACETATE 40 MG PO TABS: ORAL_TABLET | ORAL | 0 refills | Status: DC

## 2021-03-13 NOTE — Progress Notes (Signed)
Chief Complaint  Patient presents with   abnormal bleeding    Bleeding since February-heavy and clots No bleeding from February through October, bled whole month of November, no bleeding in December and started spotting in January Abdominal pain      46 y.o. G3P0 Patient's last menstrual period was 02/26/2021. The current method of family planning is tubal ligation.  Outpatient Encounter Medications as of 03/13/2021  Medication Sig   megestrol (MEGACE) 40 MG tablet 3 tablets a day for 5 days, 2 tablets a day for 5 days then 1 tablet daily   estradiol (VIVELLE-DOT) 0.1 MG/24HR patch Place 1 patch (0.1 mg total) onto the skin 2 (two) times a week. (Patient not taking: Reported on 03/13/2021)   [DISCONTINUED] FLUoxetine (PROZAC) 20 MG tablet Take 1 tablet (20 mg total) by mouth 2 (two) times daily.   [DISCONTINUED] FLUoxetine (PROZAC) 40 MG capsule Take 20 mg by mouth in the morning and at bedtime.   [DISCONTINUED] gabapentin (NEURONTIN) 100 MG capsule Take 100-300 capsules by mouth at bedtime as needed (for pain).    [DISCONTINUED] ondansetron (ZOFRAN ODT) 4 MG disintegrating tablet Take 1 tablet (4 mg total) by mouth every 8 (eight) hours as needed for nausea or vomiting.   [DISCONTINUED] potassium chloride (KLOR-CON) 10 MEQ tablet Take 1 tablet (10 mEq total) by mouth daily for 14 days.   No facility-administered encounter medications on file as of 03/13/2021.    Subjective Patient presents complaining of heavy bleeding this month Going back to approximately a year ago she had bleeding that was heavy last February and then no bleeding up until November She bled in November Did not bleed in December Spotted in January Has had heavy bleeding this month Cramping is moderate  It appears from the notes that she has been on Vivelle dot unopposed now close to 2 years for perimenopausal symptoms and lab values consistent with possible menopause  She has not been on any progesterone  either by history or by prescription noted in epic  Past Medical History:  Diagnosis Date   Anxiety    Depression    Dysmenorrhea    Restless leg syndrome     Past Surgical History:  Procedure Laterality Date   CHOLECYSTECTOMY     HERNIA REPAIR     TUBAL LIGATION      OB History     Gravida  3   Para      Term      Preterm      AB      Living  3      SAB      IAB      Ectopic      Multiple      Live Births  3           Allergies  Allergen Reactions   Amoxapine And Related     Altered mental status   Penicillins Hives    Has patient had a PCN reaction causing immediate rash, facial/tongue/throat swelling, SOB or lightheadedness with hypotension: No Has patient had a PCN reaction causing severe rash involving mucus membranes or skin necrosis: No     Social History   Socioeconomic History   Marital status: Single    Spouse name: Not on file   Number of children: Not on file   Years of education: Not on file   Highest education level: Not on file  Occupational History   Not on file  Tobacco Use  Smoking status: Every Day    Packs/day: 0.50    Types: Cigarettes   Smokeless tobacco: Never  Vaping Use   Vaping Use: Never used  Substance and Sexual Activity   Alcohol use: Yes    Comment: socially   Drug use: No   Sexual activity: Yes    Birth control/protection: Surgical    Comment: tubal  Other Topics Concern   Not on file  Social History Narrative   Not on file   Social Determinants of Health   Financial Resource Strain: Not on file  Food Insecurity: Not on file  Transportation Needs: Not on file  Physical Activity: Not on file  Stress: Not on file  Social Connections: Not on file    Family History  Problem Relation Age of Onset   Heart disease Father    Seizures Father    Stroke Father    COPD Father    Cancer Father        Lung   Hypertension Father    Dementia Father    Heart murmur Daughter     Medications:        Current Outpatient Medications:    megestrol (MEGACE) 40 MG tablet, 3 tablets a day for 5 days, 2 tablets a day for 5 days then 1 tablet daily, Disp: 45 tablet, Rfl: 0   estradiol (VIVELLE-DOT) 0.1 MG/24HR patch, Place 1 patch (0.1 mg total) onto the skin 2 (two) times a week. (Patient not taking: Reported on 03/13/2021), Disp: 8 patch, Rfl: 12  Objective Blood pressure 132/66, pulse 92, weight 204 lb (92.5 kg), last menstrual period 02/26/2021.  General WDWN female NAD Vulva:  normal appearing vulva with no masses, tenderness or lesions Vagina:  normal mucosa, no discharge Cervix:  Normal no lesions Uterus:  normal size, contour, position, consistency, mobility, non-tender Adnexa: ovaries:present,  normal adnexa in size, nontender and no masses   Pertinent ROS No burning with urination, frequency or urgency No nausea, vomiting or diarrhea Nor fever chills or other constitutional symptoms   Labs or studies No new    Impression Diagnoses this Encounter::   ICD-10-CM   1. DUB (dysfunctional uterine bleeding)  N93.8 US PELVIS (TRANSABDOMINAL ONLY)    US PELVIS TRANSVAGINAL NON-OB (TV ONLY)   on unopposed estrogen for nearly 2 years      Established relevant diagnosis(es):    Meds ordered this encounter  Medications   megestrol (MEGACE) 40 MG tablet    Sig: 3 tablets a day for 5 days, 2 tablets a day for 5 days then 1 tablet daily    Dispense:  45 tablet    Refill:  0    Labs or Scans Ordered: Orders Placed This Encounter  Procedures   US PELVIS (TRANSABDOMINAL ONLY)   US PELVIS TRANSVAGINAL NON-OB (TV ONLY)    Management:: Plan/Recommendations: Megestrol algorithm is prescribed for endometrial synchronization and then endometrial withdrawal bleed After she has her period in approximately 6 weeks I have ordered an ultrasound to evaluate her endometrium Depending on the findings of the post withdrawal bleed endometrium she may need an endometrial biopsy in the  office or formal hysteroscopy D&C endometrial ablation  In any event she will need progestational therapy going forward as she remains on her Vivelle-Dot patch  Follow up Return in about 6 weeks (around 04/24/2021) for GYN sono, Follow up, with Dr Elonda Husky.     All questions were answered.

## 2021-04-24 ENCOUNTER — Ambulatory Visit: Payer: Commercial Managed Care - PPO | Admitting: Obstetrics & Gynecology

## 2021-04-24 ENCOUNTER — Encounter: Payer: Self-pay | Admitting: Obstetrics & Gynecology

## 2021-04-24 ENCOUNTER — Ambulatory Visit (INDEPENDENT_AMBULATORY_CARE_PROVIDER_SITE_OTHER): Payer: Commercial Managed Care - PPO

## 2021-04-24 VITALS — BP 113/77 | HR 85 | Ht 64.0 in | Wt 199.0 lb

## 2021-04-24 DIAGNOSIS — N950 Postmenopausal bleeding: Secondary | ICD-10-CM

## 2021-04-24 DIAGNOSIS — N938 Other specified abnormal uterine and vaginal bleeding: Secondary | ICD-10-CM

## 2021-04-24 DIAGNOSIS — R9389 Abnormal findings on diagnostic imaging of other specified body structures: Secondary | ICD-10-CM

## 2021-04-24 MED ORDER — MEGESTROL ACETATE 40 MG PO TABS: 40.0000 mg | ORAL_TABLET | Freq: Every day | ORAL | 3 refills | Status: DC

## 2021-04-24 NOTE — Progress Notes (Signed)
Follow up appointment for results: ?Gyn sonogram for DUB on unopposed estrogen for 2 years ? ?Chief Complaint  ?Patient presents with  ? discuss [place] results  ? ? ?Blood pressure 113/77, pulse 85, height '5\' 4"'$  (1.626 m), weight 199 lb (90.3 kg). ? ?US PELVIS TRANSVAGINAL NON-OB (TV ONLY) ? ?Result Date: 04/24/2021 ?Images from the original result were not included.  ..[employer] [facility] [employer] Diplomatic Services operational officer) accredited practice Center for [facility] @ [employer] [facility] [address] [place] [address] Ordering Provider: Florian Buff, MD                                                                                                                        GYNECOLOGIC SONOGRAM Tara [name] is a 46 y.o. G3P0 unknown LMP, she is here for a pelvic sonogram for dysfunctional uterine bleeding. Uterus                      8.9 x 5.8 x 7.7 cm, Total uterine volume 209 cc, enlarged heterogeneous anteverted uterus Endometrium          26 mm, symmetrical,homogeneous thickened endometrium 26 mm,no color flow Right ovary             2 x 1.7 x 1.9 cm, wnl Left ovary                2.3 x 1.9 x 1 cm, wnl No free fluid Technician Comments: [place] TA/TV: enlarged heterogeneous anteverted uterus,homogeneous thickened endometrium 26 mm,normal ovaries,unable to slide ovaries,no free fluid,no pain during ultrasound Chaperone [address] Heide [name] 04/24/2021 9:13 AM Clinical Impression and recommendations: I have reviewed the sonogram results above, combined with the patient's current clinical course, below are my impressions and any appropriate recommendations for management based on the sonographic findings. Uterus is mildly enlarged for age and parity, normal shape and contour Endometrium is thickened homogenous Ovaries: both ovaries are relatively small, suggesting perimenopause or menopause, normal shape and morphology Tara [name] 04/24/2021 9:26 AM ? ?US PELVIS (TRANSABDOMINAL ONLY) ? ?Result Date: 04/24/2021 ?Images  from the original result were not included.  ..[employer] executive of Ultrasound Medicine Diplomatic Services operational officer) accredited practice Center for [facility] @ [place] [address] [place] [address] Ordering Provider: Florian Buff, MD  GYNECOLOGIC SONOGRAM Tara [name] is a 46 y.o. G3P0 unknown LMP, she is here for a pelvic sonogram for dysfunctional uterine bleeding. Uterus                      8.9 x 5.8 x 7.7 cm, Total uterine volume 209 cc, enlarged heterogeneous anteverted uterus Endometrium          26 mm, symmetrical,homogeneous thickened endometrium 26 mm,no color flow Right ovary             2 x 1.7 x 1.9 cm, wnl Left ovary                2.3 x 1.9 x 1 cm, wnl No free fluid Technician Comments: [place] TA/TV: enlarged heterogeneous anteverted uterus,homogeneous thickened endometrium 26 mm,normal ovaries,unable to slide ovaries,no free fluid,no pain during ultrasound Chaperone [address] Tara [name] 04/24/2021 9:13 AM Clinical Impression and recommendations: I have reviewed the sonogram results above, combined with the patient's current clinical course, below are my impressions and any appropriate recommendations for management based on the sonographic findings. Uterus is mildly enlarged for age and parity, normal shape and contour Endometrium is thickened homogenous Ovaries: both ovaries are relatively small, suggesting perimenopause or menopause, normal shape and morphology Tara [name] 04/24/2021 9:26 AM  ? ?No bleeding since she started the megesetrol ?Still on the vivelle dot 0.1 mg ? ?MEDS ordered this encounter: ?Meds ordered this encounter  ?Medications  ? megestrol (MEGACE) 40 MG tablet  ?  Sig: Take 1 tablet (40 mg total) by mouth daily.  ?  Dispense:  30 tablet  ?  Refill:  3  ? ? ?Orders for this encounter: ?No orders of the defined types were placed in this  encounter. ? ? ?Impression + Management Plan ?  ICD-10-CM   ?1. PMB (postmenopausal bleeding)  N95.0   ? on unopposed estrogen for the past 2 years, vivelle dot  ?  ?2. Thickened endometrium, 24 mm homogenous  R93.89   ? unopposed estrogen x 2 years + recently 4 weeks megestrol for synchronizing the endometrium  ?  ? ?Schedule Hysteroscopy D&C [place] ablation 06/04/21 ?Megestrol 40 mg daily until then ? ?Post op management will depend on the pathology report ? ?Follow Up: ?Return in about 2 months (around 06/24/2021) for [employer] visit, Tara [name], with Dr Elonda Husky. ? ? ? ? All questions were answered. ? ?Past Medical History:  ?Diagnosis Date  ? Anxiety   ? Depression   ? Dysmenorrhea   ? Restless leg syndrome   ? ? ?Past Surgical History:  ?Procedure Laterality Date  ? CHOLECYSTECTOMY    ? HERNIA REPAIR    ? TUBAL LIGATION    ? ? ?OB History   ? ? Gravida  ?3  ? Para  ?   ? Term  ?   ? Preterm  ?   ? AB  ?   ? Living  ?3  ?  ? ? SAB  ?   ? IAB  ?   ? Ectopic  ?   ? Multiple  ?   ? Live Births  ?3  ?   ?  ?  ? ? ?Allergies  ?Allergen Reactions  ? Amoxicillin Hives  ? Penicillins Hives  ?  Has patient had a PCN reaction causing immediate rash, facial/tongue/throat swelling, SOB or lightheadedness with hypotension: No ?Has patient had a PCN reaction causing severe rash involving mucus membranes or skin necrosis: No ?  ? ? ?  Social History  ? ?Socioeconomic History  ? Marital status: Single  ?  Spouse name: Not on file  ? Number of children: Not on file  ? Years of education: Not on file  ? Highest education level: Not on file  ?Occupational History  ? Not on file  ?Tobacco Use  ? Smoking status: Every Day  ?  Packs/day: 0.50  ?  Types: Cigarettes  ? Smokeless tobacco: Never  ?Vaping Use  ? Vaping Use: Never used  ?Substance and Sexual Activity  ? Alcohol use: Yes  ?  Comment: socially  ? Drug use: No  ? Sexual activity: Yes  ?  Birth control/protection: Surgical  ?  Comment: tubal  ?Other Topics Concern  ? Not on file   ?Social History Narrative  ? Not on file  ? ?Social Determinants of Health  ? ?Financial Resource Strain: Not on file  ?Food Insecurity: Not on file  ?Transportation Needs: Not on file  ?Physical Activity: Not on file  ?Stress: Not on file  ?Social Connections: Not on file  ? ? ?Family History  ?Problem Relation Age of Onset  ? Heart disease Father   ? Seizures Father   ? Stroke Father   ? COPD Father   ? Cancer Father   ?     Lung  ? Hypertension Father   ? Dementia Father   ? Heart murmur Daughter   ? ? ?

## 2021-04-24 NOTE — Progress Notes (Signed)
[place] TA/TV: heterogeneous anteverted uterus,homogeneous thickened endometrium 26 mm,normal ovaries,unable to slide ovaries,no free fluid,no pain during ultrasound  ?

## 2021-05-30 ENCOUNTER — Encounter (HOSPITAL_COMMUNITY): Payer: Self-pay

## 2021-05-30 NOTE — Patient Instructions (Signed)
? Your procedure is scheduled on: 06/04/2021 ? Report to [facility] Entrance at  10:00   AM. ? Call this number if you have problems the morning of surgery: [phone] ? ? Remember: ? ? Do not Eat or Drink after midnight  ? ?      No Smoking the morning of surgery ? : ? Take these medicines the morning of surgery with A SIP OF WATER: xanax, and prozac ? ? Do not wear jewelry, make-up or nail polish. ? Do not wear lotions, powders, or perfumes. You may wear deodorant. ? Do not shave 48 hours prior to surgery. Men may shave face and neck. ? Do not bring valuables to the hospital. ? Contacts, dentures or bridgework may not be worn into surgery. ? Leave suitcase in the car. After surgery it may be brought to your room. ? For patients admitted to the hospital, checkout time is 11:00 AM the day of discharge. ? ? Patients discharged the day of surgery will not be allowed to drive home. ?  ? Special Instructions: Shower using CHG night before surgery and shower the day of surgery use CHG.  Use special wash - you have one bottle of CHG for all showers.  You should use approximately 1/2 of the bottle for each shower.  ?How to Use Chlorhexidine for Bathing ?Chlorhexidine gluconate (CHG) is a germ-killing (antiseptic) solution that is used to clean the skin. It can get rid of the bacteria that normally live on the skin and can keep them away for about 24 hours. To clean your skin with CHG, you may be given: ?A CHG solution to use in the shower or as part of a sponge bath. ?A prepackaged cloth that contains CHG. ?Cleaning your skin with CHG may help lower the risk for infection: ?While you are staying in the intensive care unit of the hospital. ?If you have a vascular access, such as a central line, to provide short-term or long-term access to your veins. ?If you have a catheter to drain urine from your bladder. ?If you are on a ventilator. A ventilator is a machine that helps you breathe by moving air in and out of your  lungs. ?After surgery. ?What are the risks? ?Risks of using CHG include: ?A skin reaction. ?Hearing loss, if CHG gets in your ears and you have a perforated eardrum. ?Eye injury, if CHG gets in your eyes and is not rinsed out. ?The CHG product catching fire. ?Make sure that you avoid smoking and flames after applying CHG to your skin. ?Do not use CHG: ?If you have a chlorhexidine allergy or have previously reacted to chlorhexidine. ?On babies younger than 48 months of age. ?How to use CHG solution ?Use CHG only as told by your health care provider, and follow the instructions on the label. ?Use the full amount of CHG as directed. Usually, this is one bottle. ?During a shower ?Follow these steps when using CHG solution during a shower (unless your health care provider gives you different instructions): ?Start the shower. ?Use your normal soap and shampoo to wash your face and hair. ?Turn off the shower or move out of the shower stream. ?Pour the CHG onto a clean washcloth. Do not use any type of brush or rough-edged sponge. ?Starting at your neck, lather your body down to your toes. Make sure you follow these instructions: ?If you will be having surgery, pay special attention to the part of your body where you will be having  surgery. Scrub this area for at least 1 minute. ?Do not use CHG on your head or face. If the solution gets into your ears or eyes, rinse them well with water. ?Avoid your genital area. ?Avoid any areas of skin that have broken skin, cuts, or scrapes. ?Scrub your back and under your arms. Make sure to wash skin folds. ?Let the lather sit on your skin for 1-2 minutes or as long as told by your health care provider. ?Thoroughly rinse your entire body in the shower. Make sure that all body creases and crevices are rinsed well. ?Dry off with a clean towel. Do not put any substances on your body afterward--such as powder, lotion, or perfume--unless you are told to do so by your health care provider.  Only use lotions that are recommended by the manufacturer. ?Put on clean clothes or pajamas. ?If it is the night before your surgery, sleep in clean sheets. ? ?During a sponge bath ?Follow these steps when using CHG solution during a sponge bath (unless your health care provider gives you different instructions): ?Use your normal soap and shampoo to wash your face and hair. ?Pour the CHG onto a clean washcloth. ?Starting at your neck, lather your body down to your toes. Make sure you follow these instructions: ?If you will be having surgery, pay special attention to the part of your body where you will be having surgery. Scrub this area for at least 1 minute. ?Do not use CHG on your head or face. If the solution gets into your ears or eyes, rinse them well with water. ?Avoid your genital area. ?Avoid any areas of skin that have broken skin, cuts, or scrapes. ?Scrub your back and under your arms. Make sure to wash skin folds. ?Let the lather sit on your skin for 1-2 minutes or as long as told by your health care provider. ?Using a different clean, wet washcloth, thoroughly rinse your entire body. Make sure that all body creases and crevices are rinsed well. ?Dry off with a clean towel. Do not put any substances on your body afterward--such as powder, lotion, or perfume--unless you are told to do so by your health care provider. Only use lotions that are recommended by the manufacturer. ?Put on clean clothes or pajamas. ?If it is the night before your surgery, sleep in clean sheets. ?How to use CHG prepackaged cloths ?Only use CHG cloths as told by your health care provider, and follow the instructions on the label. ?Use the CHG cloth on clean, dry skin. ?Do not use the CHG cloth on your head or face unless your health care provider tells you to. ?When washing with the CHG cloth: ?Avoid your genital area. ?Avoid any areas of skin that have broken skin, cuts, or scrapes. ?Before surgery ?Follow these steps when using a  CHG cloth to clean before surgery (unless your health care provider gives you different instructions): ?Using the CHG cloth, vigorously scrub the part of your body where you will be having surgery. Scrub using a back-and-forth motion for 3 minutes. The area on your body should be completely wet with CHG when you are done scrubbing. ?Do not rinse. Discard the cloth and let the area air-dry. Do not put any substances on the area afterward, such as powder, lotion, or perfume. ?Put on clean clothes or pajamas. ?If it is the night before your surgery, sleep in clean sheets. ? ?For general bathing ?Follow these steps when using CHG cloths for general bathing (unless your health  care provider gives you different instructions). ?Use a separate CHG cloth for each area of your body. Make sure you wash between any folds of skin and between your fingers and toes. Wash your body in the following order, switching to a new cloth after each step: ?The front of your neck, shoulders, and chest. ?Both of your arms, under your arms, and your hands. ?Your stomach and groin area, avoiding the genitals. ?Your right leg and foot. ?Your left leg and foot. ?The back of your neck, your back, and your buttocks. ?Do not rinse. Discard the cloth and let the area air-dry. Do not put any substances on your body afterward--such as powder, lotion, or perfume--unless you are told to do so by your health care provider. Only use lotions that are recommended by the manufacturer. ?Put on clean clothes or pajamas. ?Contact a health care provider if: ?Your skin gets irritated after scrubbing. ?You have questions about using your solution or cloth. ?You swallow any chlorhexidine. Call your local poison control center (1-475-028-5838 in the [place] ?Get help right away if: ?Your eyes itch badly, or they become very red or swollen. ?Your skin itches badly and is red or swollen. ?Your hearing changes. ?You have trouble seeing. ?You have swelling or tingling in  your mouth or throat. ?You have trouble breathing. ?These symptoms may represent a serious problem that is an emergency. Do not wait to see if the symptoms will go away. Get medical help right away. [place]

## 2021-06-02 ENCOUNTER — Encounter (HOSPITAL_COMMUNITY): Payer: Self-pay

## 2021-06-02 ENCOUNTER — Other Ambulatory Visit: Payer: Self-pay

## 2021-06-02 ENCOUNTER — Other Ambulatory Visit: Payer: Self-pay | Admitting: Obstetrics & Gynecology

## 2021-06-02 ENCOUNTER — Encounter (HOSPITAL_COMMUNITY)
Admission: RE | Admit: 2021-06-02 | Discharge: 2021-06-02 | Disposition: A | Discharge status: Home / Self Care | Payer: Commercial Managed Care - PPO | Source: Ambulatory Visit | Attending: Obstetrics & Gynecology | Admitting: Obstetrics & Gynecology

## 2021-06-02 VITALS — BP 114/64 | HR 74 | Temp 98.7°F | Resp 18 | Ht 64.0 in | Wt 200.0 lb

## 2021-06-02 DIAGNOSIS — Z01818 Encounter for other preprocedural examination: Secondary | ICD-10-CM

## 2021-06-02 HISTORY — DX: Essential (primary) hypertension: I10

## 2021-06-02 LAB — COMPREHENSIVE METABOLIC PANEL
ALT: 13 U/L (ref 0–44)
AST: 16 U/L (ref 15–41)
Albumin: 3.6 g/dL (ref 3.5–5.0)
Alkaline Phosphatase: 56 U/L (ref 38–126)
Anion gap: 6 (ref 5–15)
BUN: 9 mg/dL (ref 6–20)
CO2: 24 mmol/L (ref 22–32)
Calcium: 9.2 mg/dL (ref 8.9–10.3)
Chloride: 107 mmol/L (ref 98–111)
Creatinine, Ser: 0.62 mg/dL (ref 0.44–1.00)
GFR, Estimated: >60 mL/min (ref >60)
Glucose, Bld: 102 mg/dL — ABNORMAL HIGH (ref 70–99)
Potassium: 3.2 mmol/L — ABNORMAL LOW (ref 3.5–5.1)
Sodium: 137 mmol/L (ref 135–145)
Total Bilirubin: 0.4 mg/dL (ref 0.3–1.2)
Total Protein: 7.6 g/dL (ref 6.5–8.1)

## 2021-06-02 LAB — URINALYSIS, ROUTINE W REFLEX MICROSCOPIC
Bacteria, UA: NONE SEEN
Bilirubin Urine: NEGATIVE
Glucose, UA: NEGATIVE mg/dL
Ketones, ur: NEGATIVE mg/dL
Leukocytes,Ua: NEGATIVE
Nitrite: NEGATIVE
Protein, ur: NEGATIVE mg/dL
Specific Gravity, Urine: 1.002 — ABNORMAL LOW (ref 1.005–1.030)
pH: 6 (ref 5.0–8.0)

## 2021-06-02 LAB — CBC
HCT: 37 % (ref 36.0–46.0)
Hemoglobin: 12.2 g/dL (ref 12.0–15.0)
MCH: 26.5 pg (ref 26.0–34.0)
MCHC: 33 g/dL (ref 30.0–36.0)
MCV: 80.3 fL (ref 80.0–100.0)
Platelets: 482 10*3/uL — ABNORMAL HIGH (ref 150–400)
RBC: 4.61 MIL/uL (ref 3.87–5.11)
RDW: 15.2 % (ref 11.5–15.5)
WBC: 11.3 10*3/uL — ABNORMAL HIGH (ref 4.0–10.5)
nRBC: 0 % (ref 0.0–0.2)

## 2021-06-02 LAB — PREGNANCY, URINE: Preg Test, Ur: NEGATIVE

## 2021-06-02 LAB — RAPID HIV SCREEN (HIV 1/2 AB+AG)
HIV 1/2 Antibodies: NONREACTIVE
HIV-1 P24 Antigen - HIV24: NONREACTIVE

## 2021-06-04 ENCOUNTER — Ambulatory Visit (HOSPITAL_BASED_OUTPATIENT_CLINIC_OR_DEPARTMENT_OTHER): Payer: Commercial Managed Care - PPO | Admitting: Certified Registered Nurse Anesthetist

## 2021-06-04 ENCOUNTER — Other Ambulatory Visit: Payer: Self-pay

## 2021-06-04 ENCOUNTER — Encounter (HOSPITAL_COMMUNITY): Payer: Self-pay | Admitting: Obstetrics & Gynecology

## 2021-06-04 ENCOUNTER — Ambulatory Visit (HOSPITAL_COMMUNITY)
Admission: RE | Admit: 2021-06-04 | Discharge: 2021-06-04 | Disposition: A | Discharge status: Home / Self Care | Payer: Commercial Managed Care - PPO | Attending: Obstetrics & Gynecology | Admitting: Obstetrics & Gynecology

## 2021-06-04 ENCOUNTER — Encounter (HOSPITAL_COMMUNITY)
Admission: RE | Disposition: A | Discharge status: Home / Self Care | Payer: Self-pay | Source: Home / Self Care | Attending: Obstetrics & Gynecology

## 2021-06-04 ENCOUNTER — Ambulatory Visit (HOSPITAL_COMMUNITY): Payer: Commercial Managed Care - PPO | Admitting: Certified Registered Nurse Anesthetist

## 2021-06-04 DIAGNOSIS — R9389 Abnormal findings on diagnostic imaging of other specified body structures: Secondary | ICD-10-CM

## 2021-06-04 DIAGNOSIS — N924 Excessive bleeding in the premenopausal period: Secondary | ICD-10-CM

## 2021-06-04 DIAGNOSIS — F419 Anxiety disorder, unspecified: Secondary | ICD-10-CM | POA: Diagnosis not present

## 2021-06-04 DIAGNOSIS — F32A Depression, unspecified: Secondary | ICD-10-CM | POA: Insufficient documentation

## 2021-06-04 DIAGNOSIS — I10 Essential (primary) hypertension: Secondary | ICD-10-CM | POA: Diagnosis not present

## 2021-06-04 DIAGNOSIS — Z87891 Personal history of nicotine dependence: Secondary | ICD-10-CM | POA: Insufficient documentation

## 2021-06-04 DIAGNOSIS — N950 Postmenopausal bleeding: Secondary | ICD-10-CM

## 2021-06-04 HISTORY — PX: DILATATION AND CURETTAGE/HYSTEROSCOPY WITH MINERVA: SHX6851

## 2021-06-04 SURGERY — DILATATION AND CURETTAGE/HYSTEROSCOPY WITH MINERVA
Anesthesia: General | Site: Vagina

## 2021-06-04 MED ORDER — ONDANSETRON HCL 4 MG/2ML IJ SOLN
INTRAMUSCULAR | Status: AC
  Filled 2021-06-04: qty 2

## 2021-06-04 MED ORDER — CIPROFLOXACIN IN D5W 400 MG/200ML IV SOLN
400.0000 mg | INTRAVENOUS | Status: AC
  Administered 2021-06-04: 400 mg via INTRAVENOUS

## 2021-06-04 MED ORDER — LACTATED RINGERS IV SOLN: INTRAVENOUS | Status: DC

## 2021-06-04 MED ORDER — CIPROFLOXACIN IN D5W 400 MG/200ML IV SOLN
INTRAVENOUS | Status: AC
  Filled 2021-06-04: qty 200

## 2021-06-04 MED ORDER — METRONIDAZOLE 500 MG/100ML IV SOLN
500.0000 mg | INTRAVENOUS | Status: AC
  Administered 2021-06-04: 500 mg via INTRAVENOUS

## 2021-06-04 MED ORDER — EPHEDRINE 5 MG/ML INJ
INTRAVENOUS | Status: AC
Start: 2021-06-04 — End: ?
  Filled 2021-06-04: qty 5

## 2021-06-04 MED ORDER — DEXAMETHASONE SODIUM PHOSPHATE 10 MG/ML IJ SOLN
INTRAMUSCULAR | Status: AC
  Filled 2021-06-04: qty 1

## 2021-06-04 MED ORDER — POVIDONE-IODINE 10 % EX SWAB: 2.0000 "application " | Freq: Once | CUTANEOUS | Status: DC

## 2021-06-04 MED ORDER — KETOROLAC TROMETHAMINE 30 MG/ML IJ SOLN
INTRAMUSCULAR | Status: AC
  Filled 2021-06-04: qty 1

## 2021-06-04 MED ORDER — ONDANSETRON 8 MG PO TBDP: 8.0000 mg | ORAL_TABLET | Freq: Three times a day (TID) | ORAL | 0 refills | Status: DC | PRN

## 2021-06-04 MED ORDER — PHENYLEPHRINE 80 MCG/ML (10ML) SYRINGE FOR IV PUSH (FOR BLOOD PRESSURE SUPPORT)
PREFILLED_SYRINGE | INTRAVENOUS | Status: DC | PRN
  Administered 2021-06-04 (×5): 80 ug via INTRAVENOUS

## 2021-06-04 MED ORDER — LIDOCAINE HCL (PF) 2 % IJ SOLN
INTRAMUSCULAR | Status: AC
  Filled 2021-06-04: qty 5

## 2021-06-04 MED ORDER — FENTANYL CITRATE (PF) 100 MCG/2ML IJ SOLN
INTRAMUSCULAR | Status: AC
  Filled 2021-06-04: qty 2

## 2021-06-04 MED ORDER — CHLORHEXIDINE GLUCONATE 0.12 % MT SOLN
OROMUCOSAL | Status: AC
  Administered 2021-06-04: 15 mL
  Filled 2021-06-04: qty 15

## 2021-06-04 MED ORDER — MIDAZOLAM HCL 2 MG/2ML IJ SOLN
INTRAMUSCULAR | Status: DC | PRN
  Administered 2021-06-04: 2 mg via INTRAVENOUS

## 2021-06-04 MED ORDER — CHLORHEXIDINE GLUCONATE 0.12 % MT SOLN: 15.0000 mL | Freq: Once | OROMUCOSAL | Status: DC

## 2021-06-04 MED ORDER — KETOROLAC TROMETHAMINE 30 MG/ML IJ SOLN
30.0000 mg | Freq: Once | INTRAMUSCULAR | Status: AC
  Administered 2021-06-04: 30 mg via INTRAVENOUS

## 2021-06-04 MED ORDER — PROPOFOL 10 MG/ML IV BOLUS
INTRAVENOUS | Status: DC | PRN
  Administered 2021-06-04: 200 mg via INTRAVENOUS

## 2021-06-04 MED ORDER — METRONIDAZOLE 500 MG/100ML IV SOLN
INTRAVENOUS | Status: AC
  Filled 2021-06-04: qty 100

## 2021-06-04 MED ORDER — DEXMEDETOMIDINE (PRECEDEX) IN NS 20 MCG/5ML (4 MCG/ML) IV SYRINGE
PREFILLED_SYRINGE | INTRAVENOUS | Status: DC | PRN
Start: 2021-06-04 — End: 2021-06-04
  Administered 2021-06-04 (×2): 8 ug via INTRAVENOUS

## 2021-06-04 MED ORDER — ORAL CARE MOUTH RINSE: 15.0000 mL | Freq: Once | OROMUCOSAL | Status: DC

## 2021-06-04 MED ORDER — MIDAZOLAM HCL 2 MG/2ML IJ SOLN
INTRAMUSCULAR | Status: AC
Start: 2021-06-04 — End: ?
  Filled 2021-06-04: qty 2

## 2021-06-04 MED ORDER — PHENYLEPHRINE 80 MCG/ML (10ML) SYRINGE FOR IV PUSH (FOR BLOOD PRESSURE SUPPORT)
PREFILLED_SYRINGE | INTRAVENOUS | Status: AC
Start: 2021-06-04 — End: ?
  Filled 2021-06-04: qty 10

## 2021-06-04 MED ORDER — KETOROLAC TROMETHAMINE 10 MG PO TABS: 10.0000 mg | ORAL_TABLET | Freq: Three times a day (TID) | ORAL | 0 refills | Status: DC | PRN

## 2021-06-04 MED ORDER — LACTATED RINGERS IV SOLN
INTRAVENOUS | Status: DC | PRN
Start: 2021-06-04 — End: 2021-06-04

## 2021-06-04 MED ORDER — 0.9 % SODIUM CHLORIDE (POUR BTL) OPTIME
TOPICAL | Status: DC | PRN
  Administered 2021-06-04: 1000 mL

## 2021-06-04 MED ORDER — HYDROCODONE-ACETAMINOPHEN 5-325 MG PO TABS: 1.0000 | ORAL_TABLET | Freq: Four times a day (QID) | ORAL | 0 refills | Status: DC | PRN

## 2021-06-04 MED ORDER — SODIUM CHLORIDE 0.9 % IR SOLN
Status: DC | PRN
  Administered 2021-06-04: 1000 mL

## 2021-06-04 MED ORDER — EPHEDRINE SULFATE (PRESSORS) 50 MG/ML IJ SOLN
INTRAMUSCULAR | Status: DC | PRN
  Administered 2021-06-04 (×2): 5 mg via INTRAVENOUS

## 2021-06-04 MED ORDER — ONDANSETRON HCL 4 MG/2ML IJ SOLN
INTRAMUSCULAR | Status: DC | PRN
  Administered 2021-06-04: 4 mg via INTRAVENOUS

## 2021-06-04 MED ORDER — PROPOFOL 10 MG/ML IV BOLUS
INTRAVENOUS | Status: AC
  Filled 2021-06-04: qty 20

## 2021-06-04 MED ORDER — DEXAMETHASONE SODIUM PHOSPHATE 10 MG/ML IJ SOLN
INTRAMUSCULAR | Status: DC | PRN
Start: 2021-06-04 — End: 2021-06-04
  Administered 2021-06-04: 10 mg via INTRAVENOUS

## 2021-06-04 MED ORDER — LIDOCAINE HCL (CARDIAC) PF 100 MG/5ML IV SOSY
PREFILLED_SYRINGE | INTRAVENOUS | Status: DC | PRN
  Administered 2021-06-04: 60 mg via INTRAVENOUS

## 2021-06-04 MED ORDER — FENTANYL CITRATE (PF) 250 MCG/5ML IJ SOLN
INTRAMUSCULAR | Status: DC | PRN
  Administered 2021-06-04: 50 ug via INTRAVENOUS
  Administered 2021-06-04: 25 ug via INTRAVENOUS
  Administered 2021-06-04: 50 ug via INTRAVENOUS
  Administered 2021-06-04: 25 ug via INTRAVENOUS

## 2021-06-04 SURGICAL SUPPLY — 29 items
BAG HAMPER (MISCELLANEOUS) ×3 IMPLANT
CLOTH BEACON ORANGE TIMEOUT ST (SAFETY) ×3 IMPLANT
COVER LIGHT HANDLE STERIS (MISCELLANEOUS) ×6 IMPLANT
GAUZE 4X4 16PLY ~~LOC~~+RFID DBL (SPONGE) ×6 IMPLANT
GLOVE BIOGEL PI IND STRL 7.0 (GLOVE) ×4 IMPLANT
GLOVE BIOGEL PI IND STRL 8 (GLOVE) ×2 IMPLANT
GLOVE BIOGEL PI INDICATOR 7.0 (GLOVE) ×2
GLOVE BIOGEL PI INDICATOR 8 (GLOVE) ×1
GLOVE ECLIPSE 8.0 STRL XLNG CF (GLOVE) ×6 IMPLANT
GLOVE SRG 8 PF TXTR STRL LF DI (GLOVE) ×2 IMPLANT
GLOVE SURG UNDER POLY LF SZ8 (GLOVE) ×2
GOWN STRL REUS W/TWL LRG LVL3 (GOWN DISPOSABLE) ×3 IMPLANT
GOWN STRL REUS W/TWL XL LVL3 (GOWN DISPOSABLE) ×3 IMPLANT
HANDPIECE ABLA MINERVA ENDO (MISCELLANEOUS) ×3 IMPLANT
INST SET HYSTEROSCOPY (KITS) ×3 IMPLANT
IV NS 1000ML (IV SOLUTION) ×2
IV NS 1000ML BAXH (IV SOLUTION) ×2 IMPLANT
KIT TURNOVER CYSTO (KITS) ×3 IMPLANT
MANIFOLD NEPTUNE II (INSTRUMENTS) ×3 IMPLANT
MARKER SKIN DUAL TIP RULER LAB (MISCELLANEOUS) ×3 IMPLANT
NS IRRIG 1000ML POUR BTL (IV SOLUTION) ×3 IMPLANT
PACK BASIC III (CUSTOM PROCEDURE TRAY) ×2
PACK SRG BSC III STRL LF ECLPS (CUSTOM PROCEDURE TRAY) ×2 IMPLANT
PAD ARMBOARD 7.5X6 YLW CONV (MISCELLANEOUS) ×3 IMPLANT
PAD TELFA 3X4 1S STER (GAUZE/BANDAGES/DRESSINGS) ×3 IMPLANT
SET BASIN LINEN APH (SET/KITS/TRAYS/PACK) ×3 IMPLANT
SET CYSTO W/LG BORE CLAMP LF (SET/KITS/TRAYS/PACK) ×3 IMPLANT
SHEET LAVH (DRAPES) ×3 IMPLANT
TUBE CONNECTING 12X1/4 (SUCTIONS) ×3 IMPLANT

## 2021-06-04 NOTE — Op Note (Signed)
Preoperative diagnosis:  1.   Perimenopausal bleeding ?                                        2.  Unopposed estrogen for 2 years ?                                        3.  Thickened endometrium  ? ?Postoperative diagnoses: Same as above  ? ?Procedure: Hysteroscopy, uterine curettage, endometrial ablation using Minerva ? ?Surgeon: Florian Buff  ? ?Anesthesia: Laryngeal mask airway ? ?Findings: The endometrium was normal. There were no fibroid or other abnormalities. ? ?Description of operation: The patient was taken to the operating room and placed in the supine position. She underwent general anesthesia using the laryngeal mask airway. She was placed in the dorsal lithotomy position and prepped and draped in the usual sterile fashion. A Graves speculum was placed and the anterior cervical lip was grasped with a single-tooth tenaculum. The cervix was dilated serially to allow passage of the hysteroscope. Diagnostic hysteroscopy was performed and was found to be normal. A vigorous uterine curettage was then performed and all tissue sent to pathology for evaluation. ? ?I then proceeded to perform the Minerva endometrial ablation.   ?The uterus sounded to 10.5 cm ?The handpiece was attached to the Minerva power source/machine and the handpiece passed the checklist. ?The array was squeezed down to remove all of the air present.  ?The array was then place into the endometrial cavity and deployed to a length of 6.5 cm. ?The handpiece confirmed appropriate width by being in the green portion of the visual dial. ?The cervical cuff was then inflated to the point the CO2 indicator was in the green. ?The endometrial integrity check was then performed and integrity sequence was confirmed x 2. ?The heating was then begun and carried out for a total of 2 minutes(which is standard therapy time). ?When the plasma cycle was finished,  the cervical cuff was deflated and the array was removed with tissue present on the silicon  membrane. ?There was appropriate post Minerva bleeding and uterine discharge. ? ? ?  All of the equipment worked well throughout the procedure.  The patient was awakened from anesthesia and taken to the recovery room in good stable condition all counts were correct. She received 2 g of Ancef and 30 mg of Toradol preoperatively. She will be discharged from the recovery room and followed up in the office in 1- 2 weeks.  ? ?She can expect 4 weeks of post procedure bloody watery discharge ? ?Florian Buff, MD  ?06/04/2021 ?12:13 PM ? ? ?

## 2021-06-04 NOTE — Anesthesia Preprocedure Evaluation (Signed)
Anesthesia Evaluation  ?Patient identified by MRN, date of birth, ID band ?Patient awake ? ? ? ?Reviewed: ?Allergy & Precautions, H&P , NPO status , Patient's Chart, lab work & pertinent test results, reviewed documented beta blocker date and time  ? ?Airway ?Mallampati: II ? ?TM Distance: >3 FB ?Neck ROM: full ? ? ? Dental ?no notable dental hx. ? ?  ?Pulmonary ?neg pulmonary ROS, former smoker,  ?  ?Pulmonary exam normal ?breath sounds clear to auscultation ? ? ? ? ? ? Cardiovascular ?Exercise Tolerance: Good ?hypertension, negative cardio ROS ? ? ?Rhythm:regular Rate:Normal ? ? ?  ?Neuro/Psych ?PSYCHIATRIC DISORDERS Anxiety Depression negative neurological ROS ?   ? GI/Hepatic ?negative GI ROS, Neg liver ROS,   ?Endo/Other  ?negative endocrine ROS ? Renal/GU ?negative Renal ROS  ?negative genitourinary ?  ?Musculoskeletal ? ? Abdominal ?  ?Peds ? Hematology ?negative hematology ROS ?(+)   ?Anesthesia Other Findings ? ? Reproductive/Obstetrics ?negative OB ROS ? ?  ? ? ? ? ? ? ? ? ? ? ? ? ? ?  ?  ? ? ? ? ? ? ? ? ?Anesthesia Physical ?Anesthesia Plan ? ?ASA: 2 ? ?Anesthesia Plan: General and General LMA  ? ?Post-op Pain Management:   ? ?Induction:  ? ?PONV Risk Score and Plan: Ondansetron ? ?Airway Management Planned:  ? ?Additional Equipment:  ? ?Intra-op Plan:  ? ?Post-operative Plan:  ? ?Informed Consent: I have reviewed the patients History and Physical, chart, labs and discussed the procedure including the risks, benefits and alternatives for the proposed anesthesia with the patient or authorized representative who has indicated his/her understanding and acceptance.  ? ? ? ?Dental Advisory Given ? ?Plan Discussed with: CRNA ? ?Anesthesia Plan Comments:   ? ? ? ? ? ? ?Anesthesia Quick Evaluation ? ?

## 2021-06-04 NOTE — Transfer of Care (Signed)
Immediate Anesthesia Transfer of Care Note ? ?Patient: Tara Woods ? ?Procedure(s) Performed: DILATATION AND CURETTAGE/HYSTEROSCOPY WITH MINERVA (Vagina ) ? ?Patient Location: PACU ? ?Anesthesia Type:General ? ?Level of Consciousness: awake, alert  and oriented ? ?Airway & Oxygen Therapy: Patient Spontanous Breathing and Patient connected to nasal cannula oxygen ? ?Post-op Assessment: Report given to RN and Post -op Vital signs reviewed and stable ? ?Post vital signs: Reviewed and stable ? ?Last Vitals:  ?Vitals Value Taken Time  ?BP 84/63   ?Temp 98.3   ?Pulse 90   ?Resp 13   ?SpO2 100%   ? ? ?Last Pain:  ?Vitals:  ? 06/04/21 1100  ?PainSc: 0-No pain  ?   ? ?  ? ?Complications: No notable events documented. ?

## 2021-06-04 NOTE — H&P (Signed)
?Preoperative History and Physical ? ?Tara Woods is a 46 y.o. G3P0 with No LMP recorded. (Menstrual status: Irregular Periods). admitted for a hysteroscopy uterine curettage Minerva endometrial ablation.  ?Patient presents complaining of heavy bleeding this month ?Going back to approximately a year ago she had bleeding that was heavy last February and then no bleeding up until November ?She bled in November ?Did not bleed in December ?Spotted in January ?Has had heavy bleeding this month ?Cramping is moderate ?  ?It appears from the notes that she has been on Vivelle dot unopposed now close to 2 years for perimenopausal symptoms and lab values consistent with possible menopause ?  ?She has not been on any progesterone either by history or by prescription noted in epic ? ?Megestrol algorithm is prescribed for endometrial synchronization and then endometrial withdrawal bleed ?After she has her period in approximately 6 weeks I have ordered an ultrasound to evaluate her endometrium ?Depending on the findings of the post withdrawal bleed endometrium she may need an endometrial biopsy in the office or formal hysteroscopy D&C endometrial ablation ?  ?In any event she will need progestational therapy going forward as she remains on her Vivelle-Dot patch ? ?\Megace was used and successfully resulted in amenorrhea and then successful bleed ? ?PMH:    ?Past Medical History:  ?Diagnosis Date  ? Anxiety   ? Depression   ? Dysmenorrhea   ? Hypertension   ? Restless leg syndrome   ? ? ?PSH:     ?Past Surgical History:  ?Procedure Laterality Date  ? CHOLECYSTECTOMY    ? HERNIA REPAIR    ? umbilical  ? TUBAL LIGATION    ? ? ?POb/GynH:      ?OB History   ? ? Gravida  ?3  ? Para  ?   ? Term  ?   ? Preterm  ?   ? AB  ?   ? Living  ?3  ?  ? ? SAB  ?   ? IAB  ?   ? Ectopic  ?   ? Multiple  ?   ? Live Births  ?3  ?   ?  ?  ? ? ?SH:   ?Social History  ? ?Tobacco Use  ? Smoking status: Former  ?  Packs/day: 0.50  ?  Years: 25.00  ?   Pack years: 12.50  ?  Types: Cigarettes  ?  Quit date: 05/31/2021  ?  Years since quitting: 0.0  ? Smokeless tobacco: Never  ?Vaping Use  ? Vaping Use: Never used  ?Substance Use Topics  ? Alcohol use: Not Currently  ? Drug use: No  ? ? ?FH:    ?Family History  ?Problem Relation Age of Onset  ? Heart disease Father   ? Seizures Father   ? Stroke Father   ? COPD Father   ? Cancer Father   ?     Lung  ? Hypertension Father   ? Dementia Father   ? Heart murmur Daughter   ? ? ? ?Allergies:  ?Allergies  ?Allergen Reactions  ? Amoxicillin Hives  ? Penicillins Hives  ?  Has patient had a PCN reaction causing immediate rash, facial/tongue/throat swelling, SOB or lightheadedness with hypotension: No ?Has patient had a PCN reaction causing severe rash involving mucus membranes or skin necrosis: No ?  ? ? ?Medications:       ?Current Facility-Administered Medications:  ?  chlorhexidine (PERIDEX) 0.12 % solution, , , ,  ?  ciprofloxacin (CIPRO) 400 MG/200ML IVPB, , , ,  ?  metroNIDAZOLE (FLAGYL) IVPB 500 mg, 500 mg, Intravenous, On Call to OR **AND** ciprofloxacin (CIPRO) IVPB 400 mg, 400 mg, Intravenous, On Call to OR, Florian Buff, MD ?  ketorolac (TORADOL) 30 MG/ML injection 30 mg, 30 mg, Intravenous, Once, Florian Buff, MD ?  ketorolac (TORADOL) 30 MG/ML injection, , , ,  ?  metroNIDAZOLE (FLAGYL) 500 MG/100ML IVPB, , , ,  ?  povidone-iodine 10 % swab 2 application., 2 application., Topical, Once, Laporsha Grealish, Mertie Clause, MD ? ?Review of Systems:  ? ?Review of Systems  ?Constitutional: Negative for fever, chills, weight loss, malaise/fatigue and diaphoresis.  ?HENT: Negative for hearing loss, ear pain, nosebleeds, congestion, sore throat, neck pain, tinnitus and ear discharge.   ?Eyes: Negative for blurred vision, double vision, photophobia, pain, discharge and redness.  ?Respiratory: Negative for cough, hemoptysis, sputum production, shortness of breath, wheezing and stridor.   ?Cardiovascular: Negative for chest pain,  palpitations, orthopnea, claudication, leg swelling and PND.  ?Gastrointestinal: Positive for abdominal pain. Negative for heartburn, nausea, vomiting, diarrhea, constipation, blood in stool and melena.  ?Genitourinary: Negative for dysuria, urgency, frequency, hematuria and flank pain.  ?Musculoskeletal: Negative for myalgias, back pain, joint pain and falls.  ?Skin: Negative for itching and rash.  ?Neurological: Negative for dizziness, tingling, tremors, sensory change, speech change, focal weakness, seizures, loss of consciousness, weakness and headaches.  ?Endo/Heme/Allergies: Negative for environmental allergies and polydipsia. Does not bruise/bleed easily.  ?Psychiatric/Behavioral: Negative for depression, suicidal ideas, hallucinations, memory loss and substance abuse. The patient is not nervous/anxious and does not have insomnia.   ? ? ? ?PHYSICAL EXAM: ? ?There were no vitals taken for this visit. ? ?  ?Vitals reviewed. ?Constitutional: She is oriented to person, place, and time. She appears well-developed and well-nourished.  ?HENT:  ?Head: Normocephalic and atraumatic.  ?Right Ear: External ear normal.  ?Left Ear: External ear normal.  ?Nose: Nose normal.  ?Mouth/Throat: Oropharynx is clear and moist.  ?Eyes: Conjunctivae and EOM are normal. Pupils are equal, round, and reactive to light. Right eye exhibits no discharge. Left eye exhibits no discharge. No scleral icterus.  ?Neck: Normal range of motion. Neck supple. No tracheal deviation present. No thyromegaly present.  ?Cardiovascular: Normal rate, regular rhythm, normal heart sounds and intact distal pulses.  Exam reveals no gallop and no friction rub.   ?No murmur heard. ?Respiratory: Effort normal and breath sounds normal. No respiratory distress. She has no wheezes. She has no rales. She exhibits no tenderness.  ?GI: Soft. Bowel sounds are normal. She exhibits no distension and no mass. There is tenderness. There is no rebound and no guarding.   ?Genitourinary:  ?     Vulva is normal without lesions ?Vagina is pink moist without discharge ?Cervix normal in appearance and pap is normal ?Uterus is normal size, contour, position, consistency, mobility, non-tender ?Adnexa is negative with normal sized ovaries by sonogram  ?Musculoskeletal: Normal range of motion. She exhibits no edema and no tenderness.  ?Neurological: She is alert and oriented to person, place, and time. She has normal reflexes. She displays normal reflexes. No cranial nerve deficit. She exhibits normal muscle tone. Coordination normal.  ?Skin: Skin is warm and dry. No rash noted. No erythema. No pallor.  ?Psychiatric: She has a normal mood and affect. Her behavior is normal. Judgment and thought content normal.  ? ? ?Labs: ?Results for orders placed or performed during the hospital encounter of 06/02/21 (from the past  336 hour(s))  ?Pregnancy, urine  ? Collection Time: 06/02/21  8:57 AM  ?Result Value Ref Range  ? Preg Test, Ur NEGATIVE NEGATIVE  ?CBC  ? Collection Time: 06/02/21  8:57 AM  ?Result Value Ref Range  ? WBC 11.3 (H) 4.0 - 10.5 K/uL  ? RBC 4.61 3.87 - 5.11 MIL/uL  ? Hemoglobin 12.2 12.0 - 15.0 g/dL  ? HCT 37.0 36.0 - 46.0 %  ? MCV 80.3 80.0 - 100.0 fL  ? MCH 26.5 26.0 - 34.0 pg  ? MCHC 33.0 30.0 - 36.0 g/dL  ? RDW 15.2 11.5 - 15.5 %  ? Platelets 482 (H) 150 - 400 K/uL  ? nRBC 0.0 0.0 - 0.2 %  ?Comprehensive metabolic panel  ? Collection Time: 06/02/21  8:57 AM  ?Result Value Ref Range  ? Sodium 137 135 - 145 mmol/L  ? Potassium 3.2 (L) 3.5 - 5.1 mmol/L  ? Chloride 107 98 - 111 mmol/L  ? CO2 24 22 - 32 mmol/L  ? Glucose, Bld 102 (H) 70 - 99 mg/dL  ? BUN 9 6 - 20 mg/dL  ? Creatinine, Ser 0.62 0.44 - 1.00 mg/dL  ? Calcium 9.2 8.9 - 10.3 mg/dL  ? Total Protein 7.6 6.5 - 8.1 g/dL  ? Albumin 3.6 3.5 - 5.0 g/dL  ? AST 16 15 - 41 U/L  ? ALT 13 0 - 44 U/L  ? Alkaline Phosphatase 56 38 - 126 U/L  ? Total Bilirubin 0.4 0.3 - 1.2 mg/dL  ? GFR, Estimated >60 >60 mL/min  ? Anion gap 6 5 - 15   ?Rapid HIV screen (HIV 1/2 Ab+Ag)  ? Collection Time: 06/02/21  8:57 AM  ?Result Value Ref Range  ? HIV-1 P24 Antigen - HIV24 NON REACTIVE NON REACTIVE  ? HIV 1/2 Antibodies NON REACTIVE NON REACTIVE  ? Interpretati

## 2021-06-04 NOTE — Anesthesia Procedure Notes (Signed)
Procedure Name: LMA Insertion ?Date/Time: 06/04/2021 11:37 AM ?Performed by: Karna Dupes, CRNA ?Pre-anesthesia Checklist: Emergency Drugs available, Patient identified, Suction available and Patient being monitored ?Patient Re-evaluated:Patient Re-evaluated prior to induction ?Oxygen Delivery Method: Circle system utilized ?Preoxygenation: Pre-oxygenation with 100% oxygen ?Induction Type: IV induction ?LMA: LMA inserted ?LMA Size: 4.0 ?Number of attempts: 1 ?Placement Confirmation: positive ETCO2 and breath sounds checked- equal and bilateral ?Tube secured with: Tape ?Dental Injury: Teeth and Oropharynx as per pre-operative assessment  ? ? ? ? ?

## 2021-06-05 LAB — SURGICAL PATHOLOGY

## 2021-06-05 NOTE — Anesthesia Postprocedure Evaluation (Signed)
Anesthesia Post Note ? ?Patient: Tara Woods ? ?Procedure(s) Performed: DILATATION AND CURETTAGE/HYSTEROSCOPY WITH MINERVA (Vagina ) ? ?Patient location during evaluation: Phase II ?Anesthesia Type: General ?Level of consciousness: awake ?Pain management: pain level controlled ?Vital Signs Assessment: post-procedure vital signs reviewed and stable ?Respiratory status: spontaneous breathing and respiratory function stable ?Cardiovascular status: blood pressure returned to baseline and stable ?Postop Assessment: no headache and no apparent nausea or vomiting ?Anesthetic complications: no ?Comments: Late entry ? ? ?No notable events documented. ? ? ?Last Vitals:  ?Vitals:  ? 06/04/21 1245 06/04/21 1311  ?BP: 97/65 97/69  ?Pulse: 85 79  ?Resp: 13 18  ?Temp: (P) 36.8 ?C   ?SpO2: 98% 98%  ?  ?Last Pain:  ?Vitals:  ? 06/04/21 1311  ?PainSc: 0-No pain  ? ? ?  ?  ?  ?  ?  ?  ? ?Louann Sjogren ? ? ? ? ?

## 2021-06-06 ENCOUNTER — Encounter (HOSPITAL_COMMUNITY): Payer: Self-pay | Admitting: Obstetrics & Gynecology

## 2021-06-06 ENCOUNTER — Encounter: Payer: Self-pay | Admitting: Obstetrics & Gynecology

## 2021-06-10 ENCOUNTER — Telehealth: Payer: Self-pay | Admitting: Obstetrics & Gynecology

## 2021-06-10 ENCOUNTER — Encounter: Payer: Self-pay | Admitting: Obstetrics & Gynecology

## 2021-06-10 NOTE — Telephone Encounter (Signed)
Pt sent a message to dr Elonda Husky but he is not in office and pt wanted [place] to forward it to you. She states: ? ?I put the DOT patch on yesterday as you told me to.  Today I have had the most painful cramps, nausea, and bleeding.  Bleeding to the point that I though I had peed my pants.  I have had none of these symptoms until I put the patch on.  I am just curious is to if you want me to continue on the patch or not.  I was starting to feel on the mend until today.  ?

## 2021-06-11 NOTE — Telephone Encounter (Signed)
Called patient back- ok to take off patch.  Since removing the patch- bleeding has resolved and hot flashes had resolved.  Ok to leave it off and follow up as scheduled. ? ?Janyth Pupa, DO ?Attending [place] Practice ?[facility] ? ? ?

## 2021-06-19 ENCOUNTER — Other Ambulatory Visit: Payer: Self-pay | Admitting: Obstetrics & Gynecology

## 2021-06-26 ENCOUNTER — Encounter: Payer: Self-pay | Admitting: Obstetrics & Gynecology

## 2021-06-26 ENCOUNTER — Ambulatory Visit (INDEPENDENT_AMBULATORY_CARE_PROVIDER_SITE_OTHER): Payer: Commercial Managed Care - PPO | Admitting: Obstetrics & Gynecology

## 2021-06-26 VITALS — BP 107/69 | HR 82 | Ht 64.0 in | Wt 197.0 lb

## 2021-06-26 DIAGNOSIS — Z9889 Other specified postprocedural states: Secondary | ICD-10-CM

## 2021-06-26 NOTE — Progress Notes (Signed)
  HPI: Patient returns for routine postoperative follow-up having undergone hysteroscopy uterine curettage Minerva  on 06/04/21.  The patient's immediate postoperative recovery has been unremarkable. Since hospital discharge the patient reports passed endometrial cast.   Current Outpatient Medications: ALPRAZolam (XANAX) 0.5 MG tablet, Take 0.5 mg by mouth 2 (two) times daily as needed for anxiety., Disp: , Rfl:  bisoprolol-hydrochlorothiazide (ZIAC) 2.5-6.25 MG tablet, Take 1 tablet by mouth daily., Disp: , Rfl:  FLUoxetine (PROZAC) 40 MG capsule, Take 40 mg by mouth daily., Disp: , Rfl:   ondansetron (ZOFRAN-ODT) 8 MG disintegrating tablet, Take 1 tablet (8 mg total) by mouth every 8 (eight) hours as needed for nausea or vomiting., Disp: 8 tablet, Rfl: 0 estradiol (VIVELLE-DOT) 0.1 MG/24HR patch, Place 1 patch (0.1 mg total) onto the skin 2 (two) times a week. (Patient not taking: Reported on 06/26/2021), Disp: 8 patch, Rfl: 12 temazepam (RESTORIL) 30 MG capsule, Take 30 mg by mouth at bedtime., Disp: , Rfl:   No current facility-administered medications for this visit.    Blood pressure 107/69, pulse 82, height '5\' 4"'$  (1.626 m), weight 197 lb (89.4 kg).  Physical Exam: General WDWN female NAD Vulva:  normal appearing vulva with no masses, tenderness or lesions Vagina:  normal mucosa, no discharge Cervix:  Normal no lesions Uterus:  normal size, contour, position, consistency, mobility, non-tender Adnexa: ovaries:present,  normal adnexa in size, nontender and no masses   Diagnostic Tests:   Pathology: benign  Impression + Management plan:   ICD-10-CM   1. Post-operative state  Z98.890          Medications Prescribed this encounter: No orders of the defined types were placed in this encounter.     Follow up:   ICD-10-CM   1. Post-operative state  Z98.890         Florian Buff, MD Attending Physician for [facility] 06/26/2021 2:16 PM

## 2021-07-25 ENCOUNTER — Ambulatory Visit: Payer: Commercial Managed Care - PPO | Admitting: Obstetrics & Gynecology

## 2021-10-27 ENCOUNTER — Encounter: Payer: Self-pay | Admitting: Obstetrics & Gynecology

## 2021-10-27 MED ORDER — CONJ ESTROGENS-BAZEDOXIFENE 0.45-20 MG PO TABS: 1.0000 | ORAL_TABLET | Freq: Every day | ORAL | 11 refills | Status: DC

## 2021-10-27 NOTE — Addendum Note (Signed)
Addended by: Florian Buff on: 10/27/2021 11:00 AM   Modules accepted: Orders

## 2021-12-29 ENCOUNTER — Encounter: Payer: Self-pay | Admitting: Obstetrics & Gynecology

## 2021-12-29 ENCOUNTER — Ambulatory Visit: Payer: Commercial Managed Care - PPO | Admitting: Obstetrics & Gynecology

## 2021-12-29 VITALS — BP 111/77 | HR 93 | Ht 64.0 in | Wt 215.4 lb

## 2021-12-29 DIAGNOSIS — N938 Other specified abnormal uterine and vaginal bleeding: Secondary | ICD-10-CM

## 2021-12-29 DIAGNOSIS — N946 Dysmenorrhea, unspecified: Secondary | ICD-10-CM

## 2021-12-29 NOTE — Progress Notes (Signed)
Chief Complaint  Patient presents with   talk surgery    Vaginal bleeding      46 y.o. G3P0 Patient's last menstrual period was 12/22/2021. The current method of family planning is tubal ligation.  Outpatient Encounter Medications as of 12/29/2021  Medication Sig   ALPRAZolam (XANAX) 0.5 MG tablet Take 0.5 mg by mouth 2 (two) times daily as needed for anxiety.   bisoprolol-hydrochlorothiazide (ZIAC) 2.5-6.25 MG tablet Take 1 tablet by mouth daily.   Conj Estrogens-Bazedoxifene 0.45-20 MG TABS Take 1 tablet by mouth daily. Take 1 tablet daily   FLUoxetine (PROZAC) 40 MG capsule Take 60 mg by mouth daily.   predniSONE (DELTASONE) 10 MG tablet Take 10 mg by mouth daily with breakfast.   temazepam (RESTORIL) 30 MG capsule Take 30 mg by mouth at bedtime.   triamterene-hydrochlorothiazide (MAXZIDE) 75-50 MG tablet Take 1 tablet by mouth daily.   ondansetron (ZOFRAN-ODT) 8 MG disintegrating tablet Take 1 tablet (8 mg total) by mouth every 8 (eight) hours as needed for nausea or vomiting.   [DISCONTINUED] estradiol (VIVELLE-DOT) 0.1 MG/24HR patch Place 1 patch (0.1 mg total) onto the skin 2 (two) times a week. (Patient not taking: Reported on 06/26/2021)   No facility-administered encounter medications on file as of 12/29/2021.    Subjective Pt had Minerva endometrial ablation 1/61/09 without complications For perimenopausal bleeding, on unopposed vivelle dot 0.1 mg Began bleeding again in August Placed on duavee and has continued to bleed and hurts on the left side of the pelvis No other conservtive opions at this point Past Medical History:  Diagnosis Date   Anxiety    Depression    Dysmenorrhea    Hypertension    Restless leg syndrome     Past Surgical History:  Procedure Laterality Date   CHOLECYSTECTOMY     DILATATION AND CURETTAGE/HYSTEROSCOPY WITH MINERVA N/A 06/04/2021   Procedure: DILATATION AND CURETTAGE/HYSTEROSCOPY WITH MINERVA;  Surgeon: Florian Buff, MD;   Location: [facility];  Service: Gynecology;  Laterality: N/A;   HERNIA REPAIR     umbilical   TUBAL LIGATION      OB History     Gravida  3   Para      Term      Preterm      AB      Living  3      SAB      IAB      Ectopic      Multiple      Live Births  3           Allergies  Allergen Reactions   Amoxicillin Hives   Penicillins Hives    Has patient had a PCN reaction causing immediate rash, facial/tongue/throat swelling, SOB or lightheadedness with hypotension: No Has patient had a PCN reaction causing severe rash involving mucus membranes or skin necrosis: No     Social History   Socioeconomic History   Marital status: Single    Spouse name: Not on file   Number of children: Not on file   Years of education: Not on file   Highest education level: Not on file  Occupational History   Not on file  Tobacco Use   Smoking status: Former    Packs/day: 0.50    Years: 25.00    Total pack years: 12.50    Types: Cigarettes    Quit date: 05/31/2021    Years since quitting: 0.5   Smokeless tobacco: Never  Vaping Use   Vaping Use: Never used  Substance and Sexual Activity   Alcohol use: Not Currently   Drug use: No   Sexual activity: Not Currently    Birth control/protection: Surgical    Comment: tubal, ablation  Other Topics Concern   Not on file  Social History Narrative   Not on file   Social Determinants of Health   Financial Resource Strain: Not on file  Food Insecurity: Not on file  Transportation Needs: Not on file  Physical Activity: Not on file  Stress: Not on file  Social Connections: Not on file    Family History  Problem Relation Age of Onset   Heart disease Father    Seizures Father    Stroke Father    COPD Father    Cancer Father        Lung   Hypertension Father    Dementia Father    Heart murmur Daughter     Medications:       Current Outpatient Medications:    ALPRAZolam (XANAX) 0.5 MG tablet, Take 0.5 mg by mouth  2 (two) times daily as needed for anxiety., Disp: , Rfl:    bisoprolol-hydrochlorothiazide (ZIAC) 2.5-6.25 MG tablet, Take 1 tablet by mouth daily., Disp: , Rfl:    Conj Estrogens-Bazedoxifene 0.45-20 MG TABS, Take 1 tablet by mouth daily. Take 1 tablet daily, Disp: 30 tablet, Rfl: 11   FLUoxetine (PROZAC) 40 MG capsule, Take 60 mg by mouth daily., Disp: , Rfl:    predniSONE (DELTASONE) 10 MG tablet, Take 10 mg by mouth daily with breakfast., Disp: , Rfl:    temazepam (RESTORIL) 30 MG capsule, Take 30 mg by mouth at bedtime., Disp: , Rfl:    triamterene-hydrochlorothiazide (MAXZIDE) 75-50 MG tablet, Take 1 tablet by mouth daily., Disp: , Rfl:    ondansetron (ZOFRAN-ODT) 8 MG disintegrating tablet, Take 1 tablet (8 mg total) by mouth every 8 (eight) hours as needed for nausea or vomiting., Disp: 8 tablet, Rfl: 0  Objective Blood pressure 111/77, pulse 93, height '5\' 4"'$  (1.626 m), weight 215 lb 6.4 oz (97.7 kg), last menstrual period 12/22/2021.  General WDWN female NAD Vulva:  normal appearing vulva with no masses, tenderness or lesions Vagina:  normal mucosa, no discharge Cervix:  Normal no lesions Uterus:  normal size, contour, position, consistency, mobility, non-tender  OK for TV[facility]  Adnexa: ovaries:present,  normal adnexa in size, nontender and no masses   Pertinent ROS Per HPI  Labs or studies     Impression + Management Plan: Diagnoses this Encounter::   ICD-10-CM   1. DUB (dysfunctional uterine bleeding)  N93.8    unresponsive to endometrial ablation and hormonal management    2. Dysmenorrhea  N94.6       Schedule TVH 01/21/22  Medications prescribed during  this encounter: No orders of the defined types were placed in this encounter.   Labs or Scans Ordered during this encounter: No orders of the defined types were placed in this encounter.     Follow up Return in about 1 month (around 01/30/2022) for [place], with Dr Elonda Husky.

## 2021-12-30 ENCOUNTER — Encounter: Payer: Self-pay | Admitting: Obstetrics & Gynecology

## 2022-01-13 NOTE — Patient Instructions (Signed)
Tara Woods  01/13/2022     '@[email or web address]'$ @   Your procedure is scheduled on  01/21/2022.   Report to [facility] at  0600  A.M.   Call this number if you have problems the morning of surgery:  [phone]  If you experience any cold or flu symptoms such as cough, fever, chills, shortness of breath, etc. between now and your scheduled surgery, please notify us at the above number.   Remember:  Do not eat after midnight.   You may drink clear liquids until 0330 am on 01/21/2022.      Clear liquids allowed are:                    Water, Juice (No red color; non-citric and without pulp; diabetics please choose diet or no sugar options), Carbonated beverages (diabetics please choose diet or no sugar options), Clear Tea (No creamer, milk, or cream, including half & half and powdered creamer), Black Coffee Only (No creamer, milk or cream, including half & half and powdered creamer), Plain Jell-O Only (No red color; diabetics please choose no sugar options), Clear Sports drink (No red color; diabetics please choose diet or no sugar options), and Plain Popsicles Only (No red color; diabetics please choose no sugar options)       At 0330 am on 01/21/2022 drink your carb drink. You can have nothing else to drink after this.    Take these medicines the morning of surgery with A SIP OF WATER       xanax(if needed), prozac, zofran (if needed), prednisone.     Do not wear jewelry, make-up or nail polish.  Do not wear lotions, powders, or perfumes, or deodorant.  Do not shave 48 hours prior to surgery.  Men may shave face and neck.  Do not bring valuables to the hospital.  [facility] is not responsible for any belongings or valuables.  Contacts, dentures or bridgework may not be worn into surgery.  Leave your suitcase in the car.  After surgery it may be brought to your room.  For patients admitted to the hospital, discharge time will be determined by your treatment  team.  Patients discharged the day of surgery will not be allowed to drive home and must have someone with them for 24 hours.    Special instructions:   DO NOT smoke tobacco or vape for 24 hours before your procedure.  Please read over the following fact sheets that you were given. Pain Booklet, Coughing and Deep Breathing, Blood Transfusion Information, Surgical Site Infection Prevention, Anesthesia Post-op Instructions, and Care and Recovery After Surgery      Vaginal Hysterectomy, Care After The following information offers guidance on how to care for yourself after your procedure. Your health care provider may also give you more specific instructions. If you have problems or questions, contact your health care provider. What can I expect after the procedure? After the procedure, it is common to have: Pain in the lower abdomen and vagina. Vaginal bleeding and discharge for up to 1 week. You will need to use a sanitary pad after this procedure. Difficulty having a bowel movement (constipation). Temporary problems emptying the bladder. Tiredness (fatigue). Poor appetite. Less interest in sex. Feelings of sadness or other emotions. If your ovaries were also removed, it is also common to have symptoms of menopause, such as hot flashes, night sweats, and lack of sleep (insomnia). Follow these instructions at home:  Medicines  Take over-the-counter and prescription medicines only as told by your health care provider. Ask your health care provider if the medicine prescribed to you: Requires you to avoid driving or using machinery. Can cause constipation. You may need to take these actions to prevent or treat constipation: Drink enough fluid to keep your urine pale yellow. Take over-the-counter or prescription medicines. Eat foods that are high in fiber, such as beans, whole grains, and fresh fruits and vegetables. Limit foods that are high in fat and processed sugars, such as fried or  sweet foods. Activity  Rest as told by your health care provider. Return to your normal activities as told by your health care provider. Ask your health care provider what activities are safe for you Avoid sitting for a long time without moving. Get up to take short walks every 1-2 hours. This is important to improve blood flow and breathing. Ask for help if you feel weak or unsteady. Try to have someone home with you for 1-2 weeks to help you with everyday chores. Do not lift anything that is heavier than 10 lb (4.5 kg), or the limit that you are told, until your health care provider says that it is safe. If you were given a sedative during the procedure, it can affect you for several hours. Do not drive or operate machinery until your health care provider says that it is safe. Lifestyle Do not use any products that contain nicotine or tobacco. These products include cigarettes, chewing tobacco, and vaping devices, such as e-cigarettes. These can delay healing after surgery. If you need help quitting, ask your health care provider. Do not drink alcohol until your health care provider approves. General instructions Do not douche, use tampons, or have sex for at least 6 weeks, or as told by your health care provider. If you struggle with physical or emotional changes after your procedure, speak with your health care provider or a therapist. The stitches inside your vagina will dissolve over time and do not need to be taken out. Do not take baths, swim, or use a hot tub until your health care provider approves. You may only be allowed to take showers for 2-3 weeks Wear compression stockings as told by your health care provider. These stockings help to prevent blood clots and reduce swelling in your legs. Keep all follow-up visits. This is important. Contact a health care provider if: Your pain medicine is not helping. You have a fever. You have nausea or vomiting that does not go away. You feel  dizzy. You have blood, pus, or a bad-smelling discharge from your vagina more than 1 week after the procedure. You continue to have trouble urinating 3-5 days after the procedure. Get help right away if: You have severe pain in your abdomen or back. You faint. You have heavy vaginal bleeding and blood clots, soaking through a sanitary pad in less than 1 hour. You have chest pain or shortness of breath. You have pain, swelling, or redness in your leg. These symptoms may represent a serious problem that is an emergency. Do not wait to see if the symptoms will go away. Get medical help right away. Call your local emergency services (911 in the [place] Do not drive yourself to the hospital. Summary After the procedure, it is common to have pain, vaginal bleeding, constipation, temporary problems emptying your bladder, and feelings of sadness or other emotions. Take over-the-counter and prescription medicines only as told by your health care provider. Rest  as told by your health care provider. Return to your normal activities as told by your health care provider. Contact a health care provider if your pain medicine is not helping, or you have a fever, dizziness, or trouble urinating several days after the procedure. Get help right away if you have severe pain in your abdomen or back, or if you faint, have heavy bleeding, or have chest pain or shortness of breath. This information is not intended to replace advice given to you by your health care provider. Make sure you discuss any questions you have with your health care provider. Document Revised: 03/26/2021 Document Reviewed: 09/15/2019 Elsevier Patient Education  [place] Anesthesia, Adult, Care After The following information offers guidance on how to care for yourself after your procedure. Your health care provider may also give you more specific instructions. If you have problems or questions, contact your health care  provider. What can I expect after the procedure? After the procedure, it is common for people to: Have pain or discomfort at the IV site. Have nausea or vomiting. Have a sore throat or hoarseness. Have trouble concentrating. Feel cold or chills. Feel weak, sleepy, or tired (fatigue). Have soreness and body aches. These can affect parts of the body that were not involved in surgery. Follow these instructions at home: For the time period you were told by your health care provider:  Rest. Do not participate in activities where you could fall or become injured. Do not drive or use machinery. Do not drink alcohol. Do not take sleeping pills or medicines that cause drowsiness. Do not make important decisions or sign legal documents. Do not take care of children on your own. General instructions Drink enough fluid to keep your urine pale yellow. If you have sleep apnea, surgery and certain medicines can increase your risk for breathing problems. Follow instructions from your health care provider about wearing your sleep device: Anytime you are sleeping, including during daytime naps. While taking prescription pain medicines, sleeping medicines, or medicines that make you drowsy. Return to your normal activities as told by your health care provider. Ask your health care provider what activities are safe for you. Take over-the-counter and prescription medicines only as told by your health care provider. Do not use any products that contain nicotine or tobacco. These products include cigarettes, chewing tobacco, and vaping devices, such as e-cigarettes. These can delay incision healing after surgery. If you need help quitting, ask your health care provider. Contact a health care provider if: You have nausea or vomiting that does not get better with medicine. You vomit every time you eat or drink. You have pain that does not get better with medicine. You cannot urinate or have bloody urine. You  develop a skin rash. You have a fever. Get help right away if: You have trouble breathing. You have chest pain. You vomit blood. These symptoms may be an emergency. Get help right away. Call 911. Do not wait to see if the symptoms will go away. Do not drive yourself to the hospital. Summary After the procedure, it is common to have a sore throat, hoarseness, nausea, vomiting, or to feel weak, sleepy, or fatigue. For the time period you were told by your health care provider, do not drive or use machinery. Get help right away if you have difficulty breathing, have chest pain, or vomit blood. These symptoms may be an emergency. This information is not intended to replace advice given to you by your  health care provider. Make sure you discuss any questions you have with your health care provider. Document Revised: 04/11/2021 Document Reviewed: 04/11/2021 Elsevier Patient Education  [place]. How to Use Chlorhexidine Before Surgery Chlorhexidine gluconate (CHG) is a germ-killing (antiseptic) solution that is used to clean the skin. It can get rid of the bacteria that normally live on the skin and can keep them away for about 24 hours. To clean your skin with CHG, you may be given: A CHG solution to use in the shower or as part of a sponge bath. A prepackaged cloth that contains CHG. Cleaning your skin with CHG may help lower the risk for infection: While you are staying in the intensive care unit of the hospital. If you have a vascular access, such as a central line, to provide short-term or long-term access to your veins. If you have a catheter to drain urine from your bladder. If you are on a ventilator. A ventilator is a machine that helps you breathe by moving air in and out of your lungs. After surgery. What are the risks? Risks of using CHG include: A skin reaction. Hearing loss, if CHG gets in your ears and you have a perforated eardrum. Eye injury, if CHG gets in your eyes  and is not rinsed out. The CHG product catching fire. Make sure that you avoid smoking and flames after applying CHG to your skin. Do not use CHG: If you have a chlorhexidine allergy or have previously reacted to chlorhexidine. On babies younger than 46 months of age. How to use CHG solution Use CHG only as told by your health care provider, and follow the instructions on the label. Use the full amount of CHG as directed. Usually, this is one bottle. During a shower Follow these steps when using CHG solution during a shower (unless your health care provider gives you different instructions): Start the shower. Use your normal soap and shampoo to wash your face and hair. Turn off the shower or move out of the shower stream. Pour the CHG onto a clean washcloth. Do not use any type of brush or rough-edged sponge. Starting at your neck, lather your body down to your toes. Make sure you follow these instructions: If you will be having surgery, pay special attention to the part of your body where you will be having surgery. Scrub this area for at least 1 minute. Do not use CHG on your head or face. If the solution gets into your ears or eyes, rinse them well with water. Avoid your genital area. Avoid any areas of skin that have broken skin, cuts, or scrapes. Scrub your back and under your arms. Make sure to wash skin folds. Let the lather sit on your skin for 1-2 minutes or as long as told by your health care provider. Thoroughly rinse your entire body in the shower. Make sure that all body creases and crevices are rinsed well. Dry off with a clean towel. Do not put any substances on your body afterward--such as powder, lotion, or perfume--unless you are told to do so by your health care provider. Only use lotions that are recommended by the manufacturer. Put on clean clothes or pajamas. If it is the night before your surgery, sleep in clean sheets.  During a sponge bath Follow these steps when  using CHG solution during a sponge bath (unless your health care provider gives you different instructions): Use your normal soap and shampoo to wash your face and hair.  Pour the CHG onto a clean washcloth. Starting at your neck, lather your body down to your toes. Make sure you follow these instructions: If you will be having surgery, pay special attention to the part of your body where you will be having surgery. Scrub this area for at least 1 minute. Do not use CHG on your head or face. If the solution gets into your ears or eyes, rinse them well with water. Avoid your genital area. Avoid any areas of skin that have broken skin, cuts, or scrapes. Scrub your back and under your arms. Make sure to wash skin folds. Let the lather sit on your skin for 1-2 minutes or as long as told by your health care provider. Using a different clean, wet washcloth, thoroughly rinse your entire body. Make sure that all body creases and crevices are rinsed well. Dry off with a clean towel. Do not put any substances on your body afterward--such as powder, lotion, or perfume--unless you are told to do so by your health care provider. Only use lotions that are recommended by the manufacturer. Put on clean clothes or pajamas. If it is the night before your surgery, sleep in clean sheets. How to use CHG prepackaged cloths Only use CHG cloths as told by your health care provider, and follow the instructions on the label. Use the CHG cloth on clean, dry skin. Do not use the CHG cloth on your head or face unless your health care provider tells you to. When washing with the CHG cloth: Avoid your genital area. Avoid any areas of skin that have broken skin, cuts, or scrapes. Before surgery Follow these steps when using a CHG cloth to clean before surgery (unless your health care provider gives you different instructions): Using the CHG cloth, vigorously scrub the part of your body where you will be having surgery. Scrub  using a back-and-forth motion for 3 minutes. The area on your body should be completely wet with CHG when you are done scrubbing. Do not rinse. Discard the cloth and let the area air-dry. Do not put any substances on the area afterward, such as powder, lotion, or perfume. Put on clean clothes or pajamas. If it is the night before your surgery, sleep in clean sheets.  For general bathing Follow these steps when using CHG cloths for general bathing (unless your health care provider gives you different instructions). Use a separate CHG cloth for each area of your body. Make sure you wash between any folds of skin and between your fingers and toes. Wash your body in the following order, switching to a new cloth after each step: The front of your neck, shoulders, and chest. Both of your arms, under your arms, and your hands. Your stomach and groin area, avoiding the genitals. Your right leg and foot. Your left leg and foot. The back of your neck, your back, and your buttocks. Do not rinse. Discard the cloth and let the area air-dry. Do not put any substances on your body afterward--such as powder, lotion, or perfume--unless you are told to do so by your health care provider. Only use lotions that are recommended by the manufacturer. Put on clean clothes or pajamas. Contact a health care provider if: Your skin gets irritated after scrubbing. You have questions about using your solution or cloth. You swallow any chlorhexidine. Call your local poison control center (1-9033527477 in the [place] Get help right away if: Your eyes itch badly, or they become very red or swollen. Your  skin itches badly and is red or swollen. Your hearing changes. You have trouble seeing. You have swelling or tingling in your mouth or throat. You have trouble breathing. These symptoms may represent a serious problem that is an emergency. Do not wait to see if the symptoms will go away. Get medical help right away. Call  your local emergency services (911 in the [place] Do not drive yourself to the hospital. Summary Chlorhexidine gluconate (CHG) is a germ-killing (antiseptic) solution that is used to clean the skin. Cleaning your skin with CHG may help to lower your risk for infection. You may be given CHG to use for bathing. It may be in a bottle or in a prepackaged cloth to use on your skin. Carefully follow your health care provider's instructions and the instructions on the product label. Do not use CHG if you have a chlorhexidine allergy. Contact your health care provider if your skin gets irritated after scrubbing. This information is not intended to replace advice given to you by your health care provider. Make sure you discuss any questions you have with your health care provider. Document Revised: 05/12/2021 Document Reviewed: 03/25/2020 Elsevier Patient Education  [place].

## 2022-01-14 ENCOUNTER — Other Ambulatory Visit: Payer: Self-pay | Admitting: Obstetrics & Gynecology

## 2022-01-14 DIAGNOSIS — Z01818 Encounter for other preprocedural examination: Secondary | ICD-10-CM

## 2022-01-14 MED ORDER — KETOROLAC TROMETHAMINE 30 MG/ML IJ SOLN: 30.0000 mg | Freq: Once | INTRAMUSCULAR | Status: DC

## 2022-01-15 ENCOUNTER — Encounter (HOSPITAL_COMMUNITY): Payer: Self-pay

## 2022-01-15 ENCOUNTER — Encounter (HOSPITAL_COMMUNITY)
Admission: RE | Admit: 2022-01-15 | Discharge: 2022-01-15 | Disposition: A | Discharge status: Home / Self Care | Payer: Commercial Managed Care - PPO | Source: Ambulatory Visit | Attending: Obstetrics & Gynecology | Admitting: Obstetrics & Gynecology

## 2022-01-15 VITALS — BP 116/55 | HR 88 | Temp 97.8°F | Resp 18 | Ht 64.0 in | Wt 215.4 lb

## 2022-01-15 DIAGNOSIS — Z01812 Encounter for preprocedural laboratory examination: Secondary | ICD-10-CM | POA: Insufficient documentation

## 2022-01-15 DIAGNOSIS — Z01818 Encounter for other preprocedural examination: Secondary | ICD-10-CM

## 2022-01-15 DIAGNOSIS — E876 Hypokalemia: Secondary | ICD-10-CM

## 2022-01-15 LAB — URINALYSIS, ROUTINE W REFLEX MICROSCOPIC
Bacteria, UA: NONE SEEN
Bilirubin Urine: NEGATIVE
Glucose, UA: NEGATIVE mg/dL
Ketones, ur: NEGATIVE mg/dL
Leukocytes,Ua: NEGATIVE
Nitrite: NEGATIVE
Protein, ur: NEGATIVE mg/dL
Specific Gravity, Urine: 1.009 (ref 1.005–1.030)
pH: 6 (ref 5.0–8.0)

## 2022-01-15 LAB — COMPREHENSIVE METABOLIC PANEL
ALT: 14 U/L (ref 0–44)
AST: 18 U/L (ref 15–41)
Albumin: 3.3 g/dL — ABNORMAL LOW (ref 3.5–5.0)
Alkaline Phosphatase: 65 U/L (ref 38–126)
Anion gap: 7 (ref 5–15)
BUN: 9 mg/dL (ref 6–20)
CO2: 28 mmol/L (ref 22–32)
Calcium: 8.6 mg/dL — ABNORMAL LOW (ref 8.9–10.3)
Chloride: 98 mmol/L (ref 98–111)
Creatinine, Ser: 0.61 mg/dL (ref 0.44–1.00)
GFR, Estimated: >60 mL/min (ref >60)
Glucose, Bld: 125 mg/dL — ABNORMAL HIGH (ref 70–99)
Potassium: 2.8 mmol/L — ABNORMAL LOW (ref 3.5–5.1)
Sodium: 133 mmol/L — ABNORMAL LOW (ref 135–145)
Total Bilirubin: 0.3 mg/dL (ref 0.3–1.2)
Total Protein: 7.1 g/dL (ref 6.5–8.1)

## 2022-01-15 LAB — CBC
HCT: 39.1 % (ref 36.0–46.0)
Hemoglobin: 12.8 g/dL (ref 12.0–15.0)
MCH: 28.1 pg (ref 26.0–34.0)
MCHC: 32.7 g/dL (ref 30.0–36.0)
MCV: 85.7 fL (ref 80.0–100.0)
Platelets: 354 10*3/uL (ref 150–400)
RBC: 4.56 MIL/uL (ref 3.87–5.11)
RDW: 16.8 % — ABNORMAL HIGH (ref 11.5–15.5)
WBC: 12.1 10*3/uL — ABNORMAL HIGH (ref 4.0–10.5)
nRBC: 0 % (ref 0.0–0.2)

## 2022-01-15 LAB — RAPID HIV SCREEN (HIV 1/2 AB+AG)
HIV 1/2 Antibodies: NONREACTIVE
HIV-1 P24 Antigen - HIV24: NONREACTIVE

## 2022-01-15 LAB — TYPE AND SCREEN
ABO/RH(D): A POS
Antibody Screen: NEGATIVE

## 2022-01-15 LAB — PREGNANCY, URINE: Preg Test, Ur: NEGATIVE

## 2022-01-16 ENCOUNTER — Other Ambulatory Visit: Payer: Self-pay | Admitting: Obstetrics & Gynecology

## 2022-01-16 MED ORDER — POTASSIUM CHLORIDE CRYS ER 20 MEQ PO TBCR: 20.0000 meq | EXTENDED_RELEASE_TABLET | Freq: Two times a day (BID) | ORAL | 1 refills | Status: DC

## 2022-01-16 NOTE — Pre-Procedure Instructions (Signed)
Dr Elonda Husky notified of potassium of 2.8.

## 2022-01-21 ENCOUNTER — Encounter (HOSPITAL_COMMUNITY)
Admission: RE | Disposition: A | Discharge status: Home / Self Care | Payer: Self-pay | Source: Ambulatory Visit | Attending: Obstetrics & Gynecology

## 2022-01-21 ENCOUNTER — Other Ambulatory Visit: Payer: Self-pay

## 2022-01-21 ENCOUNTER — Ambulatory Visit (HOSPITAL_BASED_OUTPATIENT_CLINIC_OR_DEPARTMENT_OTHER): Payer: Commercial Managed Care - PPO | Admitting: Anesthesiology

## 2022-01-21 ENCOUNTER — Ambulatory Visit (HOSPITAL_COMMUNITY): Payer: Commercial Managed Care - PPO | Admitting: Anesthesiology

## 2022-01-21 ENCOUNTER — Encounter (HOSPITAL_COMMUNITY): Payer: Self-pay | Admitting: Obstetrics & Gynecology

## 2022-01-21 ENCOUNTER — Ambulatory Visit (HOSPITAL_COMMUNITY)
Admission: RE | Admit: 2022-01-21 | Discharge: 2022-01-21 | Disposition: A | Discharge status: Home / Self Care | Payer: Commercial Managed Care - PPO | Source: Ambulatory Visit | Attending: Obstetrics & Gynecology | Admitting: Obstetrics & Gynecology

## 2022-01-21 DIAGNOSIS — E876 Hypokalemia: Secondary | ICD-10-CM

## 2022-01-21 DIAGNOSIS — Z8249 Family history of ischemic heart disease and other diseases of the circulatory system: Secondary | ICD-10-CM | POA: Diagnosis not present

## 2022-01-21 DIAGNOSIS — D259 Leiomyoma of uterus, unspecified: Secondary | ICD-10-CM

## 2022-01-21 DIAGNOSIS — Z87891 Personal history of nicotine dependence: Secondary | ICD-10-CM | POA: Diagnosis not present

## 2022-01-21 DIAGNOSIS — N857 Hematometra: Secondary | ICD-10-CM | POA: Diagnosis not present

## 2022-01-21 DIAGNOSIS — N888 Other specified noninflammatory disorders of cervix uteri: Secondary | ICD-10-CM | POA: Diagnosis not present

## 2022-01-21 DIAGNOSIS — G2581 Restless legs syndrome: Secondary | ICD-10-CM | POA: Insufficient documentation

## 2022-01-21 DIAGNOSIS — R9389 Abnormal findings on diagnostic imaging of other specified body structures: Secondary | ICD-10-CM

## 2022-01-21 DIAGNOSIS — N946 Dysmenorrhea, unspecified: Secondary | ICD-10-CM

## 2022-01-21 DIAGNOSIS — N938 Other specified abnormal uterine and vaginal bleeding: Secondary | ICD-10-CM | POA: Diagnosis not present

## 2022-01-21 DIAGNOSIS — I10 Essential (primary) hypertension: Secondary | ICD-10-CM | POA: Insufficient documentation

## 2022-01-21 DIAGNOSIS — N939 Abnormal uterine and vaginal bleeding, unspecified: Secondary | ICD-10-CM | POA: Insufficient documentation

## 2022-01-21 DIAGNOSIS — D219 Benign neoplasm of connective and other soft tissue, unspecified: Secondary | ICD-10-CM

## 2022-01-21 HISTORY — PX: VAGINAL HYSTERECTOMY: SHX2639

## 2022-01-21 LAB — POCT I-STAT, CHEM 8
BUN: 8 mg/dL (ref 6–20)
Calcium, Ion: 1.11 mmol/L — ABNORMAL LOW (ref 1.15–1.40)
Chloride: 98 mmol/L (ref 98–111)
Creatinine, Ser: 0.7 mg/dL (ref 0.44–1.00)
Glucose, Bld: 74 mg/dL (ref 70–99)
HCT: 43 % (ref 36.0–46.0)
Hemoglobin: 14.6 g/dL (ref 12.0–15.0)
Potassium: 3.5 mmol/L (ref 3.5–5.1)
Sodium: 137 mmol/L (ref 135–145)
TCO2: 29 mmol/L (ref 22–32)

## 2022-01-21 LAB — ABO/RH: ABO/RH(D): A POS

## 2022-01-21 SURGERY — HYSTERECTOMY, VAGINAL
Anesthesia: General

## 2022-01-21 MED ORDER — CIPROFLOXACIN HCL 500 MG PO TABS: 500.0000 mg | ORAL_TABLET | Freq: Two times a day (BID) | ORAL | 0 refills | Status: DC

## 2022-01-21 MED ORDER — PROPOFOL 10 MG/ML IV BOLUS
INTRAVENOUS | Status: AC
  Filled 2022-01-21: qty 20

## 2022-01-21 MED ORDER — ONDANSETRON 8 MG PO TBDP: 8.0000 mg | ORAL_TABLET | Freq: Three times a day (TID) | ORAL | 0 refills | Status: DC | PRN

## 2022-01-21 MED ORDER — DEXAMETHASONE SODIUM PHOSPHATE 10 MG/ML IJ SOLN
INTRAMUSCULAR | Status: DC | PRN
  Administered 2022-01-21: 10 mg via INTRAVENOUS

## 2022-01-21 MED ORDER — MIDAZOLAM HCL 5 MG/5ML IJ SOLN
INTRAMUSCULAR | Status: DC | PRN
  Administered 2022-01-21: 2 mg via INTRAVENOUS

## 2022-01-21 MED ORDER — HYDROMORPHONE HCL 1 MG/ML IJ SOLN
INTRAMUSCULAR | Status: DC | PRN
  Administered 2022-01-21: .5 mg via INTRAVENOUS

## 2022-01-21 MED ORDER — ONDANSETRON HCL 4 MG/2ML IJ SOLN
INTRAMUSCULAR | Status: DC | PRN
  Administered 2022-01-21: 4 mg via INTRAVENOUS

## 2022-01-21 MED ORDER — SCOPOLAMINE 1 MG/3DAYS TD PT72: 1.0000 | MEDICATED_PATCH | Freq: Once | TRANSDERMAL | Status: DC

## 2022-01-21 MED ORDER — LACTATED RINGERS IV SOLN: INTRAVENOUS | Status: DC

## 2022-01-21 MED ORDER — SCOPOLAMINE 1 MG/3DAYS TD PT72
MEDICATED_PATCH | TRANSDERMAL | Status: AC
  Administered 2022-01-21: 1.5 mg via TRANSDERMAL
  Filled 2022-01-21: qty 1

## 2022-01-21 MED ORDER — KETOROLAC TROMETHAMINE 10 MG PO TABS: 10.0000 mg | ORAL_TABLET | Freq: Three times a day (TID) | ORAL | 0 refills | Status: DC | PRN

## 2022-01-21 MED ORDER — BUPIVACAINE-EPINEPHRINE (PF) 0.5% -1:200000 IJ SOLN
INTRAMUSCULAR | Status: DC | PRN
  Administered 2022-01-21: 20 mL via PERINEURAL

## 2022-01-21 MED ORDER — SUGAMMADEX SODIUM 200 MG/2ML IV SOLN
INTRAVENOUS | Status: DC | PRN
  Administered 2022-01-21: 200 mg via INTRAVENOUS

## 2022-01-21 MED ORDER — ONDANSETRON HCL 4 MG/2ML IJ SOLN
INTRAMUSCULAR | Status: AC
  Filled 2022-01-21: qty 2

## 2022-01-21 MED ORDER — ONDANSETRON HCL 4 MG/2ML IJ SOLN: 4.0000 mg | Freq: Once | INTRAMUSCULAR | Status: DC | PRN

## 2022-01-21 MED ORDER — DEXAMETHASONE SODIUM PHOSPHATE 10 MG/ML IJ SOLN
INTRAMUSCULAR | Status: AC
  Filled 2022-01-21: qty 1

## 2022-01-21 MED ORDER — POTASSIUM CHLORIDE CRYS ER 20 MEQ PO TBCR: 20.0000 meq | EXTENDED_RELEASE_TABLET | Freq: Every day | ORAL | 11 refills | Status: DC

## 2022-01-21 MED ORDER — 0.9 % SODIUM CHLORIDE (POUR BTL) OPTIME
TOPICAL | Status: DC | PRN
  Administered 2022-01-21: 1000 mL

## 2022-01-21 MED ORDER — OXYCODONE-ACETAMINOPHEN 5-325 MG PO TABS: 1.0000 | ORAL_TABLET | Freq: Four times a day (QID) | ORAL | 0 refills | Status: DC | PRN

## 2022-01-21 MED ORDER — BUPIVACAINE-EPINEPHRINE (PF) 0.5% -1:200000 IJ SOLN
INTRAMUSCULAR | Status: AC
  Filled 2022-01-21: qty 30

## 2022-01-21 MED ORDER — HYDROMORPHONE HCL 1 MG/ML IJ SOLN: 0.2500 mg | INTRAMUSCULAR | Status: DC | PRN

## 2022-01-21 MED ORDER — FENTANYL CITRATE (PF) 100 MCG/2ML IJ SOLN
INTRAMUSCULAR | Status: DC | PRN
  Administered 2022-01-21: 50 ug via INTRAVENOUS
  Administered 2022-01-21 (×4): 25 ug via INTRAVENOUS

## 2022-01-21 MED ORDER — ROCURONIUM BROMIDE 10 MG/ML (PF) SYRINGE
PREFILLED_SYRINGE | INTRAVENOUS | Status: AC
  Filled 2022-01-21: qty 10

## 2022-01-21 MED ORDER — LIDOCAINE HCL (PF) 2 % IJ SOLN
INTRAMUSCULAR | Status: AC
  Filled 2022-01-21: qty 5

## 2022-01-21 MED ORDER — MEPERIDINE HCL 50 MG/ML IJ SOLN: 6.2500 mg | INTRAMUSCULAR | Status: DC | PRN

## 2022-01-21 MED ORDER — MIDAZOLAM HCL 2 MG/2ML IJ SOLN
INTRAMUSCULAR | Status: AC
  Filled 2022-01-21: qty 2

## 2022-01-21 MED ORDER — METRONIDAZOLE 500 MG/100ML IV SOLN
500.0000 mg | INTRAVENOUS | Status: AC
  Administered 2022-01-21: 500 mg via INTRAVENOUS
  Filled 2022-01-21: qty 100

## 2022-01-21 MED ORDER — DEXMEDETOMIDINE HCL IN NACL 80 MCG/20ML IV SOLN
INTRAVENOUS | Status: AC
  Filled 2022-01-21: qty 20

## 2022-01-21 MED ORDER — CHLORHEXIDINE GLUCONATE 0.12 % MT SOLN
15.0000 mL | Freq: Once | OROMUCOSAL | Status: AC
  Administered 2022-01-21: 15 mL via OROMUCOSAL

## 2022-01-21 MED ORDER — PROPOFOL 10 MG/ML IV BOLUS
INTRAVENOUS | Status: DC | PRN
  Administered 2022-01-21: 150 mg via INTRAVENOUS

## 2022-01-21 MED ORDER — ROCURONIUM BROMIDE 100 MG/10ML IV SOLN
INTRAVENOUS | Status: DC | PRN
  Administered 2022-01-21: 60 mg via INTRAVENOUS
  Administered 2022-01-21 (×2): 10 mg via INTRAVENOUS

## 2022-01-21 MED ORDER — CIPROFLOXACIN IN D5W 400 MG/200ML IV SOLN
400.0000 mg | INTRAVENOUS | Status: AC
  Administered 2022-01-21: 400 mg via INTRAVENOUS
  Filled 2022-01-21: qty 200

## 2022-01-21 MED ORDER — STERILE WATER FOR IRRIGATION IR SOLN
Status: DC | PRN
  Administered 2022-01-21: 1000 mL

## 2022-01-21 MED ORDER — POVIDONE-IODINE 10 % EX SWAB: 2.0000 | Freq: Once | CUTANEOUS | Status: DC

## 2022-01-21 MED ORDER — LIDOCAINE 2% (20 MG/ML) 5 ML SYRINGE
INTRAMUSCULAR | Status: DC | PRN
  Administered 2022-01-21: 100 mg via INTRAVENOUS

## 2022-01-21 MED ORDER — ORAL CARE MOUTH RINSE: 15.0000 mL | Freq: Once | OROMUCOSAL | Status: AC

## 2022-01-21 MED ORDER — HYDROMORPHONE HCL 1 MG/ML IJ SOLN
INTRAMUSCULAR | Status: AC
  Filled 2022-01-21: qty 0.5

## 2022-01-21 MED ORDER — FENTANYL CITRATE (PF) 250 MCG/5ML IJ SOLN
INTRAMUSCULAR | Status: AC
  Filled 2022-01-21: qty 5

## 2022-01-21 SURGICAL SUPPLY — 38 items
APPLIER CLIP 13 LRG OPEN (CLIP) ×1
APR CLP LRG 13 20 CLIP (CLIP) ×1
BAG HAMPER (MISCELLANEOUS) ×2 IMPLANT
BLADE SURG SZ10 CARB STEEL (BLADE) IMPLANT
CLIP APPLIE 13 LRG OPEN (CLIP) IMPLANT
CLOTH BEACON ORANGE TIMEOUT ST (SAFETY) ×2 IMPLANT
COVER LIGHT HANDLE STERIS (MISCELLANEOUS) ×4 IMPLANT
DRAPE HALF SHEET 40X57 (DRAPES) ×2 IMPLANT
DRAPE STERI URO 9X17 APER PCH (DRAPES) ×2 IMPLANT
ELECT REM PT RETURN 9FT ADLT (ELECTROSURGICAL) ×1
ELECTRODE REM PT RTRN 9FT ADLT (ELECTROSURGICAL) ×2 IMPLANT
GAUZE 4X4 16PLY ~~LOC~~+RFID DBL (SPONGE) ×2 IMPLANT
GLOVE BIOGEL PI IND STRL 7.0 (GLOVE) ×4 IMPLANT
GLOVE BIOGEL PI IND STRL 8 (GLOVE) ×2 IMPLANT
GLOVE ECLIPSE 8.0 STRL XLNG CF (GLOVE) ×4 IMPLANT
GOWN STRL REUS W/TWL LRG LVL3 (GOWN DISPOSABLE) ×4 IMPLANT
GOWN STRL REUS W/TWL XL LVL3 (GOWN DISPOSABLE) ×2 IMPLANT
IV NS IRRIG 3000ML ARTHROMATIC (IV SOLUTION) ×2 IMPLANT
KIT BLADEGUARD II DBL (SET/KITS/TRAYS/PACK) ×2 IMPLANT
KIT TURNOVER CYSTO (KITS) ×2 IMPLANT
MANIFOLD NEPTUNE II (INSTRUMENTS) ×2 IMPLANT
NDL HYPO 21X1.5 SAFETY (NEEDLE) ×2 IMPLANT
NEEDLE HYPO 21X1.5 SAFETY (NEEDLE) ×1 IMPLANT
NS IRRIG 1000ML POUR BTL (IV SOLUTION) ×2 IMPLANT
PACK PERI GYN (CUSTOM PROCEDURE TRAY) ×2 IMPLANT
PAD ARMBOARD 7.5X6 YLW CONV (MISCELLANEOUS) ×2 IMPLANT
SET BASIN LINEN APH (SET/KITS/TRAYS/PACK) ×2 IMPLANT
SUT MNCRL+ AB 3-0 CT1 36 (SUTURE) IMPLANT
SUT MON AB 3-0 SH 27 (SUTURE) IMPLANT
SUT MONOCRYL AB 3-0 CT1 36IN (SUTURE)
SUT VIC AB 0 CT1 27 (SUTURE) ×3
SUT VIC AB 0 CT1 27XCR 8 STRN (SUTURE) ×4 IMPLANT
SYR CONTROL 10ML LL (SYRINGE) ×2 IMPLANT
TRAY FOLEY MTR SLVR 16FR STAT (SET/KITS/TRAYS/PACK) IMPLANT
TRAY FOLEY W/BAG SLVR 16FR (SET/KITS/TRAYS/PACK) ×1
TRAY FOLEY W/BAG SLVR 16FR ST (SET/KITS/TRAYS/PACK) IMPLANT
VERSALIGHT (MISCELLANEOUS) ×2 IMPLANT
WATER STERILE IRR 1000ML POUR (IV SOLUTION) ×2 IMPLANT

## 2022-01-21 NOTE — H&P (Signed)
Preoperative History and Physical  Tara Woods is a 46 y.o. G3P0 with Patient's last menstrual period was 12/22/2021. admitted for a TVH BSO.  Pt had [facility]a endometrial ablation 09/13/27 without complications For perimenopausal bleeding, on unopposed vivelle dot 0.1 mg Began bleeding again in August Placed on duavee and has continued to bleed and hurts on the left side of the pelvis No other conservtive opions at this point  PMH:    Past Medical History:  Diagnosis Date   Anxiety    Depression    Dysmenorrhea    Hypertension    Restless leg syndrome     PSH:     Past Surgical History:  Procedure Laterality Date   CHOLECYSTECTOMY     DILATATION AND CURETTAGE/HYSTEROSCOPY WITH MINERVA N/A 06/04/2021   Procedure: DILATATION AND CURETTAGE/HYSTEROSCOPY WITH MINERVA;  Surgeon: Florian Buff, MD;  Location: [facility];  Service: Gynecology;  Laterality: N/A;   HERNIA REPAIR     umbilical   TUBAL LIGATION      POb/GynH:      OB History     Gravida  3   Para      Term      Preterm      AB      Living  3      SAB      IAB      Ectopic      Multiple      Live Births  3           SH:   Social History   Tobacco Use   Smoking status: Former    Packs/day: 0.50    Years: 25.00    Total pack years: 12.50    Types: Cigarettes    Quit date: 05/31/2021    Years since quitting: 0.6   Smokeless tobacco: Never  Vaping Use   Vaping Use: Never used  Substance Use Topics   Alcohol use: Not Currently   Drug use: No    FH:    Family History  Problem Relation Age of Onset   Heart disease Father    Seizures Father    Stroke Father    COPD Father    Cancer Father        Lung   Hypertension Father    Dementia Father    Heart murmur Daughter      Allergies:  Allergies  Allergen Reactions   Amoxicillin Hives   Penicillins Hives    Has patient had a PCN reaction causing immediate rash, facial/tongue/throat swelling, SOB or lightheadedness with  hypotension: No Has patient had a PCN reaction causing severe rash involving mucus membranes or skin necrosis: No     Medications:       Current Facility-Administered Medications:    metroNIDAZOLE (FLAGYL) IVPB 500 mg, 500 mg, Intravenous, On Call to OR **AND** ciprofloxacin (CIPRO) IVPB 400 mg, 400 mg, Intravenous, On Call to OR, Florian Buff, MD   lactated ringers infusion, , Intravenous, Continuous, Battula, Rajamani C, MD, Last Rate: 50 mL/hr at 01/21/22 0704, New Bag at 01/21/22 0704   povidone-iodine 10 % swab 2 Application, 2 Application, Topical, Once, Florian Buff, MD   scopolamine (TRANSDERM-SCOP) 1 MG/3DAYS 1.5 mg, 1 patch, Transdermal, Once, Battula, Rajamani C, MD, 1.5 mg at 01/21/22 9371  Review of Systems:   Review of Systems  Constitutional: Negative for fever, chills, weight loss, malaise/fatigue and diaphoresis.  HENT: Negative for hearing loss, ear pain, nosebleeds, congestion, sore throat, neck  pain, tinnitus and ear discharge.   Eyes: Negative for blurred vision, double vision, photophobia, pain, discharge and redness.  Respiratory: Negative for cough, hemoptysis, sputum production, shortness of breath, wheezing and stridor.   Cardiovascular: Negative for chest pain, palpitations, orthopnea, claudication, leg swelling and PND.  Gastrointestinal: Positive for abdominal pain. Negative for heartburn, nausea, vomiting, diarrhea, constipation, blood in stool and melena.  Genitourinary: Negative for dysuria, urgency, frequency, hematuria and flank pain.  Musculoskeletal: Negative for myalgias, back pain, joint pain and falls.  Skin: Negative for itching and rash.  Neurological: Negative for dizziness, tingling, tremors, sensory change, speech change, focal weakness, seizures, loss of consciousness, weakness and headaches.  Endo/Heme/Allergies: Negative for environmental allergies and polydipsia. Does not bruise/bleed easily.  Psychiatric/Behavioral: Negative for  depression, suicidal ideas, hallucinations, memory loss and substance abuse. The patient is not nervous/anxious and does not have insomnia.      PHYSICAL EXAM:  Blood pressure 116/80, pulse 80, temperature 98 F (36.7 C), temperature source Oral, resp. rate 16, last menstrual period 12/22/2021, SpO2 96 %.    Vitals reviewed. Constitutional: She is oriented to person, place, and time. She appears well-developed and well-nourished.  HENT:  Head: Normocephalic and atraumatic.  Right Ear: External ear normal.  Left Ear: External ear normal.  Nose: Nose normal.  Mouth/Throat: Oropharynx is clear and moist.  Eyes: Conjunctivae and EOM are normal. Pupils are equal, round, and reactive to light. Right eye exhibits no discharge. Left eye exhibits no discharge. No scleral icterus.  Neck: Normal range of motion. Neck supple. No tracheal deviation present. No thyromegaly present.  Cardiovascular: Normal rate, regular rhythm, normal heart sounds and intact distal pulses.  Exam reveals no gallop and no friction rub.   No murmur heard. Respiratory: Effort normal and breath sounds normal. No respiratory distress. She has no wheezes. She has no rales. She exhibits no tenderness.  GI: Soft. Bowel sounds are normal. She exhibits no distension and no mass. There is tenderness. There is no rebound and no guarding.  Genitourinary:       Vulva is normal without lesions Vagina is pink moist without discharge Cervix normal in appearance and pap is normal Uterus is normal size, contour, position, consistency, mobility, non-tender Adnexa is negative with normal sized ovaries by sonogram  Musculoskeletal: Normal range of motion. She exhibits no edema and no tenderness.  Neurological: She is alert and oriented to person, place, and time. She has normal reflexes. She displays normal reflexes. No cranial nerve deficit. She exhibits normal muscle tone. Coordination normal.  Skin: Skin is warm and dry. No rash noted.  No erythema. No pallor.  Psychiatric: She has a normal mood and affect. Her behavior is normal. Judgment and thought content normal.    Labs: Results for orders placed or performed during the hospital encounter of 01/15/22 (from the past 336 hour(s))  CBC   Collection Time: 01/15/22  3:05 PM  Result Value Ref Range   WBC 12.1 (H) 4.0 - 10.5 K/uL   RBC 4.56 3.87 - 5.11 MIL/uL   Hemoglobin 12.8 12.0 - 15.0 g/dL   HCT 39.1 36.0 - 46.0 %   MCV 85.7 80.0 - 100.0 fL   MCH 28.1 26.0 - 34.0 pg   MCHC 32.7 30.0 - 36.0 g/dL   RDW 16.8 (H) 11.5 - 15.5 %   Platelets 354 150 - 400 K/uL   nRBC 0.0 0.0 - 0.2 %  Comprehensive metabolic panel   Collection Time: 01/15/22  3:05 PM  Result  Value Ref Range   Sodium 133 (L) 135 - 145 mmol/L   Potassium 2.8 (L) 3.5 - 5.1 mmol/L   Chloride 98 98 - 111 mmol/L   CO2 28 22 - 32 mmol/L   Glucose, Bld 125 (H) 70 - 99 mg/dL   BUN 9 6 - 20 mg/dL   Creatinine, Ser 0.61 0.44 - 1.00 mg/dL   Calcium 8.6 (L) 8.9 - 10.3 mg/dL   Total Protein 7.1 6.5 - 8.1 g/dL   Albumin 3.3 (L) 3.5 - 5.0 g/dL   AST 18 15 - 41 U/L   ALT 14 0 - 44 U/L   Alkaline Phosphatase 65 38 - 126 U/L   Total Bilirubin 0.3 0.3 - 1.2 mg/dL   GFR, Estimated >60 >60 mL/min   Anion gap 7 5 - 15  Rapid HIV screen (HIV 1/2 Ab+Ag)   Collection Time: 01/15/22  3:05 PM  Result Value Ref Range   HIV-1 P24 Antigen - HIV24 NON REACTIVE NON REACTIVE   HIV 1/2 Antibodies NON REACTIVE NON REACTIVE   Interpretation (HIV Ag Ab)      A non reactive test result means that HIV 1 or HIV 2 antibodies and HIV 1 p24 antigen were not detected in the specimen.  Urinalysis, Routine w reflex microscopic Urine, Clean Catch   Collection Time: 01/15/22  3:05 PM  Result Value Ref Range   Color, Urine YELLOW YELLOW   APPearance CLEAR CLEAR   Specific Gravity, Urine 1.009 1.005 - 1.030   pH 6.0 5.0 - 8.0   Glucose, UA NEGATIVE NEGATIVE mg/dL   Hgb urine dipstick SMALL (A) NEGATIVE   Bilirubin Urine NEGATIVE  NEGATIVE   Ketones, ur NEGATIVE NEGATIVE mg/dL   Protein, ur NEGATIVE NEGATIVE mg/dL   Nitrite NEGATIVE NEGATIVE   Leukocytes,Ua NEGATIVE NEGATIVE   RBC / HPF 0-5 0 - 5 RBC/hpf   WBC, UA 0-5 0 - 5 WBC/hpf   Bacteria, UA NONE SEEN NONE SEEN   Squamous Epithelial / LPF 0-5 0 - 5  Pregnancy, urine   Collection Time: 01/15/22  3:05 PM  Result Value Ref Range   Preg Test, Ur NEGATIVE NEGATIVE  Type and screen   Collection Time: 01/15/22  3:05 PM  Result Value Ref Range   ABO/RH(D) A POS    Antibody Screen NEG    Sample Expiration 01/29/2022,2359    Extend sample reason      NO TRANSFUSIONS OR PREGNANCY IN THE PAST 3 MONTHS Performed at [facility], [address], [place], [place] [address]     EKG: Orders placed or performed during the hospital encounter of 06/02/21   EKG 12-Lead   EKG 12-Lead    Imaging Studies:                                                                                                                         GYNECOLOGIC SONOGRAM     DAYZHA POGOSYAN is a 46 y.o. G3P0 unknown LMP, she  is here for a pelvic sonogram for dysfunctional uterine bleeding.   Uterus                      8.9 x 5.8 x 7.7 cm, Total uterine volume 209 cc, enlarged heterogeneous anteverted uterus   Endometrium          26 mm, symmetrical,homogeneous thickened endometrium 26 mm,no color flow    Right ovary             2 x 1.7 x 1.9 cm, wnl   Left ovary                2.3 x 1.9 x 1 cm, wnl   No free fluid    Technician Comments:   [place] TA/TV: enlarged heterogeneous anteverted uterus,homogeneous thickened endometrium 26 mm,normal ovaries,unable to slide ovaries,no free fluid,no pain during ultrasound    Chaperone [address] Heide Guile 04/24/2021 9:13 AM   Clinical Impression and recommendations:   I have reviewed the sonogram results above, combined with the patient's current clinical course, below are my impressions and any appropriate recommendations for management based  on the sonographic findings.   Uterus is mildly enlarged for age and parity, normal shape and contour Endometrium is thickened homogenous Ovaries: both ovaries are relatively small, suggesting perimenopause or menopause, normal shape and morphology     Florian Buff 04/24/2021 9:26 AM      Assessment: AUB, post menopausal on combination HRT Dysmenorrhea S/P endometrial ablation 05/2021  Plan: TVH BSO if possible (pt is post menopausal already)  Pt understands the risks of surgery including but not limited t  excessive bleeding requiring transfusion or reoperation, post-operative infection requiring prolonged hospitalization or re-hospitalization and antibiotic therapy, and damage to other organs including bladder, bowel, ureters and major vessels.  The patient also understands the alternative treatment options which were discussed in full.  All questions were answered.  Florian Buff 01/21/2022 7:14 AM   Florian Buff 01/21/2022 7:10 AM

## 2022-01-21 NOTE — Anesthesia Preprocedure Evaluation (Signed)
Anesthesia Evaluation  Patient identified by MRN, date of birth, ID band Patient awake    Reviewed: Allergy & Precautions, H&P , NPO status , Patient's Chart, lab work & pertinent test results, reviewed documented beta blocker date and time   Airway Mallampati: II  TM Distance: >3 FB Neck ROM: Full    Dental  (+) Dental Advisory Given, Caps   Pulmonary neg pulmonary ROS, former smoker   Pulmonary exam normal breath sounds clear to auscultation       Cardiovascular Exercise Tolerance: Good hypertension, Pt. on medications and Pt. on home beta blockers Normal cardiovascular exam Rhythm:Regular Rate:Normal     Neuro/Psych  PSYCHIATRIC DISORDERS Anxiety Depression     Neuromuscular disease (RLS)    GI/Hepatic negative GI ROS, Neg liver ROS,,,  Endo/Other  negative endocrine ROS    Renal/GU negative Renal ROS  negative genitourinary   Musculoskeletal negative musculoskeletal ROS (+)    Abdominal   Peds negative pediatric ROS (+)  Hematology negative hematology ROS (+)   Anesthesia Other Findings   Reproductive/Obstetrics negative OB ROS                             Anesthesia Physical Anesthesia Plan  ASA: 2  Anesthesia Plan: General   Post-op Pain Management: Dilaudid IV   Induction: Intravenous  PONV Risk Score and Plan: 4 or greater and Ondansetron, Dexamethasone, Midazolam and Scopolamine patch - Pre-op  Airway Management Planned: Oral ETT  Additional Equipment:   Intra-op Plan:   Post-operative Plan: Extubation in OR  Informed Consent: I have reviewed the patients History and Physical, chart, labs and discussed the procedure including the risks, benefits and alternatives for the proposed anesthesia with the patient or authorized representative who has indicated his/her understanding and acceptance.     Dental advisory given  Plan Discussed with: CRNA and  Surgeon  Anesthesia Plan Comments:         Anesthesia Quick Evaluation

## 2022-01-21 NOTE — Anesthesia Procedure Notes (Signed)
Procedure Name: Intubation Date/Time: 01/21/2022 7:40 AM  Performed by: Gwyndolyn Saxon, CRNAPre-anesthesia Checklist: Patient identified, Emergency Drugs available, Suction available and Patient being monitored Patient Re-evaluated:Patient Re-evaluated prior to induction Oxygen Delivery Method: Circle system utilized Preoxygenation: Pre-oxygenation with 100% oxygen Induction Type: IV induction Ventilation: Mask ventilation without difficulty Laryngoscope Size: Miller and 2 Grade View: Grade I Tube type: Oral Tube size: 7.0 mm Number of attempts: 1 Airway Equipment and Method: Stylet Placement Confirmation: ETT inserted through vocal cords under direct vision, positive ETCO2 and breath sounds checked- equal and bilateral Secured at: 21 cm Tube secured with: Tape Dental Injury: Teeth and Oropharynx as per pre-operative assessment

## 2022-01-21 NOTE — Discharge Instructions (Signed)
Take scopolamine patch off on saturday

## 2022-01-21 NOTE — Transfer of Care (Signed)
Immediate Anesthesia Transfer of Care Note  Patient: Tara Woods  Procedure(s) Performed: HYSTERECTOMY VAGINAL, TOTAL  Patient Location: PACU  Anesthesia Type:General  Level of Consciousness: awake, alert , and oriented  Airway & Oxygen Therapy: Patient Spontanous Breathing and Patient connected to nasal cannula oxygen  Post-op Assessment: Report given to RN and Post -op Vital signs reviewed and stable  Post vital signs: Reviewed and stable  Last Vitals:  Vitals Value Taken Time  BP 135/69 01/21/22 1019  Temp    Pulse 103 01/21/22 1021  Resp 16 01/21/22 1021  SpO2 95 % 01/21/22 1021  Vitals shown include unvalidated device data.  Last Pain:  Vitals:   01/21/22 0636  TempSrc: Oral  PainSc: 0-No pain         Complications: No notable events documented.

## 2022-01-21 NOTE — Anesthesia Postprocedure Evaluation (Signed)
Anesthesia Post Note  Patient: Tara Woods  Procedure(s) Performed: HYSTERECTOMY VAGINAL, TOTAL  Patient location during evaluation: PACU Anesthesia Type: General Level of consciousness: awake and alert and oriented Pain management: pain level controlled Vital Signs Assessment: post-procedure vital signs reviewed and stable Respiratory status: spontaneous breathing, nonlabored ventilation and respiratory function stable Cardiovascular status: blood pressure returned to baseline and stable Postop Assessment: no apparent nausea or vomiting Anesthetic complications: no  No notable events documented.   Last Vitals:  Vitals:   01/21/22 1045 01/21/22 1100  BP: 120/73 111/72  Pulse: 95 92  Resp: 14 15  Temp:    SpO2: 97% 98%    Last Pain:  Vitals:   01/21/22 1045  TempSrc:   PainSc: 0-No pain                 Pattijo Juste C Logyn Dedominicis

## 2022-01-21 NOTE — Op Note (Signed)
Preoperative diagnosis:  1.  AUB                                         2.  S/P endometrial ablation                                         3.  Dysmenorrhea                                         4.  Fibroids, 206 gram uterus pre op  Postoperative diagnosis:  Same as above + hematometra  Procedure:  Vaginal hysterectomy, requiring morcellation  Surgeon:  Florian Buff MD  Anesthesia:  General Endotracheal  Findings:  normal uterus tubes ovaries, fibroid uterus , hematometra post ablation    Description of operation:  The patient was taken from the preoperative area to the operating room in stable condition. She underwent GET anesthesia ands  she was placed in the dorsal lithotomy position. Patient was prepped and draped in the usual sterile fashion and a Foley catheter was placed.  A weighted speculum was placed and the cervix was grasped with thyroid tenaculums both anteriorly and posteriorly.  0.5% Marcainewith 1/200,000 epinephrine was injected in a circumferential fashion about the cervix and the electrocautery unit was used to incise the vagina and push at all cervix.  The posterior cul-de-sac was then entered sharply without difficulty.  The uterosacral ligaments were clamped cut and inspection suture ligated and held.  The cardinal ligaments were then clamped cut transfixion suture ligated and cut. The anterior peritoneum was identified the anterior cul-de-sac was entered sharply without difficulty. The anterior and posterior leaves of the broad ligament were plicated and the uterine vessels were clamped cut and suture ligated. Morcellation was then performed, the cervix and 8 portions of uterus resulted in order to remove the specimen. Serial pedicles were taken of the fundus with each pedicle being clamped cut and suture ligated. The utero-ovarian ligaments were crossclamped the uterus was removed and both pedicles were transfixion suture ligated. There was good hemostasis of all the  pedicles.  The anterior posterior vagina and peritoneum were closed in interrupted fashion with good resultant hemostasis. Prior to  closure the lower pelvis and vagina were irrigated vigorously.  The sponge needle and instrument counts were correct x 3.  Total blood loss for the procedure was 400 cc.  The patient received cipro 400/flagyl 500 mg + Toradol IV preoperatively prophylactically.  She was taken to the recovery room in good stable condition awake alert doing well.  Florian Buff 01/21/2022, 10:41 AM

## 2022-01-22 LAB — SURGICAL PATHOLOGY

## 2022-01-28 ENCOUNTER — Encounter (HOSPITAL_COMMUNITY): Payer: Self-pay | Admitting: Obstetrics & Gynecology

## 2022-01-30 ENCOUNTER — Ambulatory Visit (INDEPENDENT_AMBULATORY_CARE_PROVIDER_SITE_OTHER): Payer: Commercial Managed Care - PPO | Admitting: Obstetrics & Gynecology

## 2022-01-30 ENCOUNTER — Encounter: Payer: Self-pay | Admitting: Obstetrics & Gynecology

## 2022-01-30 VITALS — BP 107/73 | HR 71 | Ht 64.0 in | Wt 213.0 lb

## 2022-01-30 DIAGNOSIS — Z9889 Other specified postprocedural states: Secondary | ICD-10-CM

## 2022-01-30 MED ORDER — ESTRADIOL 0.1 MG/24HR TD PTTW: 1.0000 | MEDICATED_PATCH | TRANSDERMAL | 12 refills | Status: AC

## 2022-01-30 NOTE — Progress Notes (Signed)
  HPI: Patient returns for routine postoperative follow-up having undergone TVH on 01/21/22.  The patient's immediate postoperative recovery has been unremarkable. Since hospital discharge the patient reports no problems.   Current Outpatient Medications: ALPRAZolam (XANAX) 0.5 MG tablet, Take 0.5 mg by mouth 2 (two) times daily., Disp: , Rfl:  bisoprolol-hydrochlorothiazide (ZIAC) 2.5-6.25 MG tablet, Take 1 tablet by mouth daily., Disp: , Rfl:  [START ON 02/02/2022] estradiol (VIVELLE-DOT) 0.1 MG/24HR patch, Place 1 patch (0.1 mg total) onto the skin 2 (two) times a week., Disp: 8 patch, Rfl: 12 FLUoxetine HCl 60 MG TABS, Take 60 mg by mouth in the morning., Disp: , Rfl:  ketorolac (TORADOL) 10 MG tablet, Take 1 tablet (10 mg total) by mouth every 8 (eight) hours as needed., Disp: 15 tablet, Rfl: 0 oxyCODONE-acetaminophen (PERCOCET) 5-325 MG tablet, Take 1 tablet by mouth every 6 (six) hours as needed for severe pain., Disp: 28 tablet, Rfl: 0 potassium chloride SA (KLOR-CON M) 20 MEQ tablet, Take 1 tablet (20 mEq total) by mouth daily., Disp: 30 tablet, Rfl: 11 temazepam (RESTORIL) 30 MG capsule, Take 30 mg by mouth at bedtime. (Patient not taking: Reported on 01/30/2022), Disp: , Rfl:   No current facility-administered medications for this visit.    Blood pressure 107/73, pulse 71, height '5\' 4"'$  (1.626 m), weight 213 lb (96.6 kg), last menstrual period 12/22/2021.  Physical Exam: Cuff intact flat non tender  Bimanual non tender no masses  Diagnostic Tests:   Pathology: benign  Impression + Management plan: (B16.945) Post-operative state:  S/P TVH 01/21/22  (primary encounter diagnosis)     Medications Prescribed this encounter: Orders Placed This Encounter     estradiol (VIVELLE-DOT) 0.1 MG/24HR patch         Sig: Place 1 patch (0.1 mg total) onto the skin 2 (two) times a week.         Dispense:  8 patch         Refill:  12     Follow up: No follow-ups on file.     Florian Buff, MD Attending Physician for the [facility] 01/30/2022 9:25 AM

## 2022-03-05 ENCOUNTER — Ambulatory Visit (INDEPENDENT_AMBULATORY_CARE_PROVIDER_SITE_OTHER): Payer: Commercial Managed Care - PPO | Admitting: Obstetrics & Gynecology

## 2022-03-05 ENCOUNTER — Encounter: Payer: Self-pay | Admitting: Obstetrics & Gynecology

## 2022-03-05 VITALS — BP 111/78 | HR 86 | Ht 64.0 in | Wt 221.0 lb

## 2022-03-05 DIAGNOSIS — Z9889 Other specified postprocedural states: Secondary | ICD-10-CM

## 2022-03-05 NOTE — Progress Notes (Signed)
  HPI: Patient returns for routine postoperative follow-up having undergone TVH on 01/22/23.  The patient's immediate postoperative recovery has been unremarkable. Since hospital discharge the patient reports no problems.   Current Outpatient Medications: ALPRAZolam (XANAX) 1 MG tablet, Take 1 mg by mouth 2 (two) times daily., Disp: , Rfl:  bisoprolol-hydrochlorothiazide (ZIAC) 2.5-6.25 MG tablet, Take 1 tablet by mouth daily., Disp: , Rfl:  estradiol (VIVELLE-DOT) 0.1 MG/24HR patch, Place 1 patch (0.1 mg total) onto the skin 2 (two) times a week., Disp: 8 patch, Rfl: 12 FLUoxetine HCl 60 MG TABS, Take 60 mg by mouth in the morning., Disp: , Rfl:  potassium chloride SA (KLOR-CON M) 20 MEQ tablet, Take 1 tablet (20 mEq total) by mouth daily., Disp: 30 tablet, Rfl: 11 rosuvastatin (CRESTOR) 20 MG tablet, Take 20 mg by mouth daily., Disp: , Rfl:  traZODone (DESYREL) 50 MG tablet, Take 100 mg by mouth at bedtime., Disp: , Rfl:   No current facility-administered medications for this visit.    Blood pressure 111/78, pulse 86, height '5\' 4"'$  (1.626 m), weight 221 lb (100.2 kg), last menstrual period 12/22/2021.  Physical Exam: Normal post op vaginal exam, sutures are beginning to dissolve  Diagnostic Tests:   Pathology: benign  Impression + Management plan:   ICD-10-CM   1. Post-operative state:  S/P [facility] 01/21/22  Z98.890          Medications Prescribed this encounter: No orders of the defined types were placed in this encounter.     Follow up: prn   Florian Buff, MD Attending Physician for the [facility] 03/05/2022 4:36 PM

## 2022-04-01 ENCOUNTER — Other Ambulatory Visit: Payer: Self-pay | Admitting: Obstetrics & Gynecology

## 2022-04-16 ENCOUNTER — Encounter: Payer: Self-pay | Admitting: Adult Health

## 2022-04-16 ENCOUNTER — Ambulatory Visit (HOSPITAL_COMMUNITY)
Admission: RE | Admit: 2022-04-16 | Discharge: 2022-04-16 | Disposition: A | Discharge status: Home / Self Care | Payer: Commercial Managed Care - PPO | Source: Ambulatory Visit | Attending: Adult Health | Admitting: Adult Health

## 2022-04-16 ENCOUNTER — Ambulatory Visit: Payer: Commercial Managed Care - PPO | Admitting: Adult Health

## 2022-04-16 VITALS — BP 108/68 | HR 80 | Ht 64.0 in | Wt 223.0 lb

## 2022-04-16 DIAGNOSIS — R635 Abnormal weight gain: Secondary | ICD-10-CM

## 2022-04-16 DIAGNOSIS — M7989 Other specified soft tissue disorders: Secondary | ICD-10-CM | POA: Diagnosis not present

## 2022-04-16 DIAGNOSIS — M79662 Pain in left lower leg: Secondary | ICD-10-CM

## 2022-04-16 NOTE — Progress Notes (Signed)
  Subjective:     Patient ID: Tara Woods, female   DOB: [birth year], 47 y.o.   MRN: [id number]  HPI Tara Woods is a 47 year old white female,single, sp hysterectomy, in complaining of pain left lower leg for 2 weeks and weight gain,since hysterectomy December 2023. She is on Vivelle dot 0.1 mg.   PCP is Dr Thornton Park   Review of Systems Pain left lower leg for 2 weeks +weight gain, about 30 lbs since hysterectomy in December 2023  Reviewed past medical,surgical, social and family history. Reviewed medications and allergies.     Objective:   Physical Exam BP 108/68 (BP Location: Left Arm, Patient Position: Sitting, Cuff Size: Large)   Pulse 80   Ht 5\' 4"  (1.626 m)   Wt 223 lb (101.2 kg)   LMP 12/22/2021   BMI 38.28 kg/m     Skin warm and dry.  Lungs: clear to ausculation bilaterally. Cardiovascular: regular rate and rhythm.  Has tenderness in left calf and shin, and swelling, she says left leg always bigger than right, left calf was 17.5 inches and right calf was 16.5 inches. +Homans sign, +pulses dorsal and at ankle, skin changes but has been tanning and she says has birth mark. Fall risk is moderate  [facility] - 04/16/22 0916       Pregnancy Intention Screening   Does the patient want to become pregnant in the next year? N/A    Does the patient's partner want to become pregnant in the next year? N/A    Would the patient like to discuss contraceptive options today? N/A      Contraception Wrap Up   Current Method Female Sterilization   hyst   End Method Female Sterilization   hyst   Contraception Counseling Provided No             Assessment:     1. Pain and swelling of left lower leg Pain left lower leg for about 2 weeks +swelling  Some skin changes but she has been tanning and has birth mark +tenderness calf and shin with +Homans sign  +pulses  Will get venous [facility] at [facility] now Note given for to excuse from work today Will talk when results back today  - US  Venous Img Lower Unilateral Left (DVT); Future Will get Terrence Dupont to see if precert needed  2. Weight gain Has gained about 30 lbs since vaginal hysterectomy in December     Plan:     Return in 1 week to talk about weight more and medications to aide in weight loss and counseling

## 2022-04-23 ENCOUNTER — Ambulatory Visit: Payer: Commercial Managed Care - PPO | Admitting: Adult Health

## 2022-04-23 ENCOUNTER — Encounter: Payer: Self-pay | Admitting: Adult Health

## 2022-04-23 VITALS — BP 116/73 | HR 89 | Ht 65.0 in | Wt 226.5 lb

## 2022-04-23 DIAGNOSIS — Z713 Dietary counseling and surveillance: Secondary | ICD-10-CM | POA: Diagnosis not present

## 2022-04-23 DIAGNOSIS — Z6837 Body mass index (BMI) 37.0-37.9, adult: Secondary | ICD-10-CM | POA: Diagnosis not present

## 2022-04-23 MED ORDER — PHENTERMINE HCL 15 MG PO CAPS: 15.0000 mg | ORAL_CAPSULE | ORAL | 0 refills | Status: AC

## 2022-04-23 NOTE — Progress Notes (Signed)
  Subjective:     Patient ID: Tara Woods, female   DOB: [birth year], 47 y.o.   MRN: [id number]  HPI Tara Woods is a 47 year old white female,single, sp hysterectomy in requesting help losing weight, she has gained about 30 lbs since having hysterectomy she says.  PCP is Dr Thornton Park  Review of Systems +weight gain Reviewed past medical,surgical, social and family history. Reviewed medications and allergies.     Objective:   Physical Exam BP 116/73 (BP Location: Left Arm, Patient Position: Sitting, Cuff Size: Large)   Pulse 89   Ht 5\' 5"  (1.651 m)   Wt 226 lb 8 oz (102.7 kg)   LMP 12/22/2021   BMI 37.69 kg/m     Skin warm and dry.  Lungs: clear to ausculation bilaterally. Cardiovascular: regular rate and rhythm.   Upstream - 04/23/22 1457       Pregnancy Intention Screening   Does the patient want to become pregnant in the next year? N/A    Does the patient's partner want to become pregnant in the next year? N/A    Would the patient like to discuss contraceptive options today? N/A      Contraception Wrap Up   Current Method Female Sterilization   hyst   End Method Female Sterilization   hyst   Contraception Counseling Provided No             Assessment:     1. Encounter for weight loss counseling Incrase water Try to walk more Watch carbs Will rx phentermine 15 mg 1 daily Meds ordered this encounter  Medications   phentermine 15 MG capsule    Sig: Take 1 capsule (15 mg total) by mouth every morning.    Dispense:  30 capsule    Refill:  0    Order Specific Question:   Supervising Provider    Answer:   Elonda Husky, LUTHER H [[id number]]     2. Body mass index 37.0-37.9, adult    Plan:     Follow up in 4 weeks for weight and BP check

## 2022-05-20 ENCOUNTER — Ambulatory Visit: Payer: Commercial Managed Care - PPO | Admitting: Adult Health

## 2022-05-21 ENCOUNTER — Ambulatory Visit: Payer: Commercial Managed Care - PPO | Admitting: Adult Health

## 2023-08-13 ENCOUNTER — Encounter: Payer: Self-pay | Admitting: Advanced Practice Midwife
